# Patient Record
Sex: Female | Born: 1950
Health system: Southern US, Community
[De-identification: ages and names within clinical notes are randomized; demographics above are authoritative.]

## PROBLEM LIST (undated history)

## (undated) DIAGNOSIS — E7800 Pure hypercholesterolemia, unspecified: Secondary | ICD-10-CM

## (undated) DIAGNOSIS — I4891 Unspecified atrial fibrillation: Secondary | ICD-10-CM

## (undated) DIAGNOSIS — I10 Essential (primary) hypertension: Secondary | ICD-10-CM

## (undated) DIAGNOSIS — I499 Cardiac arrhythmia, unspecified: Secondary | ICD-10-CM

## (undated) HISTORY — PX: TUBAL LIGATION: SHX77

## (undated) HISTORY — DX: Unspecified atrial fibrillation: I48.91

---

## 2003-08-28 ENCOUNTER — Other Ambulatory Visit: Admission: RE | Admit: 2003-08-28 | Discharge: 2003-08-28 | Payer: Self-pay | Admitting: Family Medicine

## 2004-10-01 ENCOUNTER — Ambulatory Visit (HOSPITAL_COMMUNITY): Admission: RE | Admit: 2004-10-01 | Discharge: 2004-10-01 | Payer: Self-pay | Admitting: Family Medicine

## 2005-06-11 ENCOUNTER — Ambulatory Visit (HOSPITAL_COMMUNITY): Admission: RE | Admit: 2005-06-11 | Discharge: 2005-06-11 | Payer: Self-pay | Admitting: Family Medicine

## 2007-04-02 ENCOUNTER — Ambulatory Visit (HOSPITAL_COMMUNITY): Admission: RE | Admit: 2007-04-02 | Discharge: 2007-04-02 | Payer: Self-pay | Admitting: Family Medicine

## 2007-04-12 ENCOUNTER — Ambulatory Visit (HOSPITAL_COMMUNITY): Admission: RE | Admit: 2007-04-12 | Discharge: 2007-04-12 | Payer: Self-pay | Admitting: Family Medicine

## 2007-06-01 ENCOUNTER — Ambulatory Visit (HOSPITAL_COMMUNITY): Admission: RE | Admit: 2007-06-01 | Discharge: 2007-06-01 | Payer: Self-pay | Admitting: Family Medicine

## 2013-05-05 ENCOUNTER — Emergency Department (HOSPITAL_COMMUNITY)
Admission: EM | Admit: 2013-05-05 | Discharge: 2013-05-05 | Disposition: A | Discharge status: Home / Self Care | Payer: BC Managed Care – PPO | Source: Home / Self Care | Attending: Emergency Medicine | Admitting: Emergency Medicine

## 2013-05-05 ENCOUNTER — Encounter (HOSPITAL_COMMUNITY): Payer: Self-pay | Admitting: Emergency Medicine

## 2013-05-05 DIAGNOSIS — F419 Anxiety disorder, unspecified: Secondary | ICD-10-CM

## 2013-05-05 DIAGNOSIS — R079 Chest pain, unspecified: Secondary | ICD-10-CM

## 2013-05-05 DIAGNOSIS — K219 Gastro-esophageal reflux disease without esophagitis: Secondary | ICD-10-CM

## 2013-05-05 DIAGNOSIS — F411 Generalized anxiety disorder: Secondary | ICD-10-CM

## 2013-05-05 HISTORY — DX: Essential (primary) hypertension: I10

## 2013-05-05 HISTORY — DX: Pure hypercholesterolemia, unspecified: E78.00

## 2013-05-05 MED ORDER — LORAZEPAM 0.5 MG PO TABS: 0.5000 mg | ORAL_TABLET | Freq: Three times a day (TID) | ORAL | Status: DC | PRN

## 2013-05-05 NOTE — ED Provider Notes (Signed)
Medical screening examination/treatment/procedure(s) were performed by non-physician practitioner and as supervising physician I was immediately available for consultation/collaboration.  Philipp Deputy, M.D.  Harden Mo, MD 05/05/13 [phone]

## 2013-05-05 NOTE — ED Notes (Signed)
Patient reports a 2 day history of chest pain that predictably has occurred for 2 days, pain occurs between 10 and 12 every morning.  Patient reports "drinking dark soda and taking antacids ", followed by belching which relieved patient of right chest pain.  This pain was sharp when it occurred.  Patient reports feeling nervous with episodes, reports feeling nervous with chest pain since first started.  Patient has felt overwhelming nervousness yesterday, noted fast heart rate last evening.  No nausea, no vomiting with episode.  This morning patient feeling weak, drained, tired, and nervous.  Reports she went to work and co-worker reported bp of 170/90.  Denies any pain today, no pain currently.

## 2013-05-05 NOTE — Discharge Instructions (Signed)
Switch from ranitidine to omeprazole (generic version of Prilosec). Follow up with your own doctor about your anxiety. Also follow up with the cardiologist to have your heart checked.    Chest Pain (Nonspecific) It is often hard to give a specific diagnosis for the cause of chest pain. There is always a chance that your pain could be related to something serious, such as a heart attack or a blood clot in the lungs. You need to follow up with your caregiver for further evaluation. CAUSES   Heartburn.  Pneumonia or bronchitis.  Anxiety or stress.  Inflammation around your heart (pericarditis) or lung (pleuritis or pleurisy).  A blood clot in the lung.  A collapsed lung (pneumothorax). It can develop suddenly on its own (spontaneous pneumothorax) or from injury (trauma) to the chest.  Shingles infection (herpes zoster virus). The chest wall is composed of bones, muscles, and cartilage. Any of these can be the source of the pain.  The bones can be bruised by injury.  The muscles or cartilage can be strained by coughing or overwork.  The cartilage can be affected by inflammation and become sore (costochondritis). DIAGNOSIS  Lab tests or other studies, such as X-rays, electrocardiography, stress testing, or cardiac imaging, may be needed to find the cause of your pain.  TREATMENT   Treatment depends on what may be causing your chest pain. Treatment may include:  Acid blockers for heartburn.  Anti-inflammatory medicine.  Pain medicine for inflammatory conditions.  Antibiotics if an infection is present.  You may be advised to change lifestyle habits. This includes stopping smoking and avoiding alcohol, caffeine, and chocolate.  You may be advised to keep your head raised (elevated) when sleeping. This reduces the chance of acid going backward from your stomach into your esophagus.  Most of the time, nonspecific chest pain will improve within 2 to 3 days with rest and mild pain  medicine. HOME CARE INSTRUCTIONS   If antibiotics were prescribed, take your antibiotics as directed. Finish them even if you start to feel better.  For the next few days, avoid physical activities that bring on chest pain. Continue physical activities as directed.  Do not smoke.  Avoid drinking alcohol.  Only take over-the-counter or prescription medicine for pain, discomfort, or fever as directed by your caregiver.  Follow your caregiver's suggestions for further testing if your chest pain does not go away.  Keep any follow-up appointments you made. If you do not go to an appointment, you could develop lasting (chronic) problems with pain. If there is any problem keeping an appointment, you must call to reschedule. SEEK MEDICAL CARE IF:   You think you are having problems from the medicine you are taking. Read your medicine instructions carefully.  Your chest pain does not go away, even after treatment.  You develop a rash with blisters on your chest. SEEK IMMEDIATE MEDICAL CARE IF:   You have increased chest pain or pain that spreads to your arm, neck, jaw, back, or abdomen.  You develop shortness of breath, an increasing cough, or you are coughing up blood.  You have severe back or abdominal pain, feel nauseous, or vomit.  You develop severe weakness, fainting, or chills.  You have a fever. THIS IS AN EMERGENCY. Do not wait to see if the pain will go away. Get medical help at once. Call your local emergency services (911 in [place] Do not drive yourself to the hospital. MAKE SURE YOU:   Understand these instructions.  Will  watch your condition.  Will get help right away if you are not doing well or get worse. Document Released: 10/02/2004 Document Revised: 03/17/2011 Document Reviewed: 07/29/2007 [facility] Patient Information 2014 [place].  Gastroesophageal Reflux Disease, Adult Gastroesophageal reflux disease (GERD) happens when acid from your stomach goes  into your food pipe (esophagus). The acid can cause a burning feeling in your chest. Over time, the acid can make small holes (ulcers) in your food pipe.  HOME CARE  Ask your doctor for advice about:  Losing weight.  Quitting smoking.  Alcohol use.  Avoid foods and drinks that make your problems worse. You may want to avoid:  Caffeine and alcohol.  Chocolate.  Mints.  Garlic and onions.  Spicy foods.  Citrus fruits, such as oranges, lemons, or limes.  Foods that contain tomato, such as sauce, chili, salsa, and pizza.  Fried and fatty foods.  Avoid lying down for 3 hours before you go to bed or before you take a nap.  Eat small meals often, instead of large meals.  Wear loose-fitting clothing. Do not wear anything tight around your waist.  Raise (elevate) the head of your bed 6 to 8 inches with wood blocks. Using extra pillows does not help.  Only take medicines as told by your doctor.  Do not take aspirin or ibuprofen. GET HELP RIGHT AWAY IF:   You have pain in your arms, neck, jaw, teeth, or back.  Your pain gets worse or changes.  You feel sick to your stomach (nauseous), throw up (vomit), or sweat (diaphoresis).  You feel short of breath, or you pass out (faint).  Your throw up is green, yellow, black, or looks like coffee grounds or blood.  Your poop (stool) is red, bloody, or black. MAKE SURE YOU:   Understand these instructions.  Will watch your condition.  Will get help right away if you are not doing well or get worse. Document Released: 06/11/2007 Document Revised: 03/17/2011 Document Reviewed: 07/12/2010 [facility] Patient Information 2014 [place], [place].

## 2013-05-05 NOTE — ED Provider Notes (Signed)
CSN: [id number]     Arrival date & time 05/05/13  0907 History   First MD Initiated Contact with Patient 05/05/13 1002     Chief Complaint  Patient presents with  . Anxiety  . Chest Pain   (Consider location/radiation/quality/duration/timing/severity/associated sxs/prior Treatment) HPI Comments: Pt describes having R chest pain on 4/28 and 4/29 from approximately 10am to 12pm.  Treated self with diet Dr. Malachi Moran and otc zantac for complete relief of sx both days.  Separately, the evening of 4/29, pt was driving home from work when began feeling very "nervous" to the point she was shaking. No chest pain at this time, but felt heart was racing (not irregular, just fast).  Pt has remained nervous all last night and is still nervous today. At work this morning, she checked her bp and it was 170/90 so pt decided to come to urgent care. No chest pain sx today, only the nervous feeling. Has had similar nervous feelings in the past ("but not in a long time") and cannot identify any new stressors or triggers in her life that could be causing it except increase in work hours (which pt reports she asked for and wanted).    Patient is a 63 y.o. female presenting with chest pain. The history is provided by the patient.  Chest Pain Pain location:  R chest Pain quality comment:  Unable to specify; describes only as "pain" Pain radiates to:  Does not radiate Pain radiates to the back: no   Pain severity:  Moderate Onset quality:  Gradual Duration:  2 hours Timing:  Sporadic Progression:  Resolved Chronicity:  New Relieved by: zantac and a diet dr. Malachi Moran. Worsened by:  Nothing tried Associated symptoms: anxiety and heartburn   Associated symptoms: no abdominal pain, no back pain, no diaphoresis, no dizziness, no fever, no nausea, no palpitations, no shortness of breath and not vomiting   Risk factors: high cholesterol and hypertension     Past Medical History  Diagnosis Date  . Hypertension   . High  cholesterol    Past Surgical History  Procedure Laterality Date  . Tubal ligation     History reviewed. No pertinent family history. History  Substance Use Topics  . Smoking status: Never Smoker   . Smokeless tobacco: Not on file  . Alcohol Use: No   OB History   Grav Para Term Preterm Abortions TAB SAB Ect Mult Living                 Review of Systems  Constitutional: Negative for fever and diaphoresis.  Respiratory: Negative for shortness of breath.   Cardiovascular: Positive for chest pain. Negative for palpitations and leg swelling.  Gastrointestinal: Positive for heartburn. Negative for nausea, vomiting and abdominal pain.  Musculoskeletal: Negative for back pain.  Neurological: Negative for dizziness and syncope.  Psychiatric/Behavioral: The patient is nervous/anxious.     Allergies  Review of patient's allergies indicates no known allergies.  Home Medications   Prior to Admission medications   Medication Sig Start Date End Date Taking? Authorizing Provider  LISINOPRIL-HYDROCHLOROTHIAZIDE PO Take by mouth.   Yes Historical Provider, MD  lovastatin (MEVACOR) 40 MG tablet Take 40 mg by mouth at bedtime.   Yes Historical Provider, MD  ranitidine (ZANTAC) 75 MG tablet Take 75 mg by mouth as needed for heartburn.   Yes Historical Provider, MD  LORazepam (ATIVAN) 0.5 MG tablet Take 1 tablet (0.5 mg total) by mouth every 8 (eight) hours as needed for anxiety.  05/05/13   Carvel Getting, NP   BP 120/83  Pulse 90  Temp(Src) 98.1 F (36.7 C) (Oral)  Resp 20  SpO2 99% Physical Exam  Constitutional: She appears well-developed and well-nourished. No distress.  Cardiovascular: Normal rate and regular rhythm.   Pulmonary/Chest: Effort normal and breath sounds normal.  Abdominal: Soft. Bowel sounds are normal. She exhibits no distension. There is no tenderness. There is no rebound and no guarding.  Skin: Skin is warm and dry.  Psychiatric: Her speech is normal and behavior is  normal. Judgment and thought content normal. Her mood appears anxious. Cognition and memory are normal.    ED Course  Procedures (including critical care time) Labs Review Labs Reviewed - No data to display  Imaging Review No results found.   MDM   1. Anxiety   2. GERD (gastroesophageal reflux disease)   3. Chest pain   EKG shows NSR, 82bpm.  I think pt has 2 separate issues, heartburn/gerd sx and anxiety.  Rx lorazepam 0.5mg  TID prn anxiety #10. Suggested pt switch to prilosec from zantac and take daily. Given [facility] cardiology contact info for f/u; pt reports has had stress testing but it has been many years. Pt to f/u with pcp about anxiety and gerd sx, and about chest pain- pt can either go through pcp to arrange visit with cardiologist or use referral from today.     Carvel Getting, NP 05/05/13 1025

## 2013-05-05 NOTE — ED Notes (Signed)
Patient is dressed and ready for discharge

## 2013-07-21 ENCOUNTER — Emergency Department (HOSPITAL_COMMUNITY)
Admission: EM | Admit: 2013-07-21 | Discharge: 2013-07-21 | Disposition: A | Discharge status: Other Acute Inpatient Hospital | Payer: BC Managed Care – PPO | Source: Home / Self Care | Attending: Family Medicine | Admitting: Family Medicine

## 2013-07-21 ENCOUNTER — Encounter (HOSPITAL_COMMUNITY): Payer: Self-pay | Admitting: Emergency Medicine

## 2013-07-21 ENCOUNTER — Inpatient Hospital Stay (HOSPITAL_COMMUNITY)
Admission: EM | Admit: 2013-07-21 | Discharge: 2013-07-23 | DRG: 309 | Disposition: A | Discharge status: Home / Self Care | Payer: BC Managed Care – PPO | Attending: Interventional Cardiology | Admitting: Interventional Cardiology

## 2013-07-21 ENCOUNTER — Emergency Department (HOSPITAL_COMMUNITY): Payer: BC Managed Care – PPO

## 2013-07-21 DIAGNOSIS — I4891 Unspecified atrial fibrillation: Secondary | ICD-10-CM | POA: Diagnosis present

## 2013-07-21 DIAGNOSIS — I480 Paroxysmal atrial fibrillation: Secondary | ICD-10-CM

## 2013-07-21 DIAGNOSIS — E7800 Pure hypercholesterolemia, unspecified: Secondary | ICD-10-CM | POA: Diagnosis present

## 2013-07-21 DIAGNOSIS — I10 Essential (primary) hypertension: Secondary | ICD-10-CM | POA: Insufficient documentation

## 2013-07-21 DIAGNOSIS — R112 Nausea with vomiting, unspecified: Secondary | ICD-10-CM

## 2013-07-21 DIAGNOSIS — E669 Obesity, unspecified: Secondary | ICD-10-CM | POA: Diagnosis present

## 2013-07-21 DIAGNOSIS — Z6841 Body Mass Index (BMI) 40.0 and over, adult: Secondary | ICD-10-CM | POA: Diagnosis not present

## 2013-07-21 HISTORY — DX: Cardiac arrhythmia, unspecified: I49.9

## 2013-07-21 LAB — CBC
HCT: 37.3 % (ref 36.0–46.0)
Hemoglobin: 12.3 g/dL (ref 12.0–15.0)
MCH: 28.2 pg (ref 26.0–34.0)
MCHC: 33 g/dL (ref 30.0–36.0)
MCV: 85.6 fL (ref 78.0–100.0)
Platelets: 302 K/uL (ref 150–400)
RBC: 4.36 MIL/uL (ref 3.87–5.11)
RDW: 13.4 % (ref 11.5–15.5)
WBC: 8.3 K/uL (ref 4.0–10.5)

## 2013-07-21 LAB — URINALYSIS, ROUTINE W REFLEX MICROSCOPIC
Bilirubin Urine: NEGATIVE
Glucose, UA: NEGATIVE mg/dL
Hgb urine dipstick: NEGATIVE
Ketones, ur: NEGATIVE mg/dL
Leukocytes, UA: NEGATIVE
Nitrite: NEGATIVE
Protein, ur: NEGATIVE mg/dL
Specific Gravity, Urine: 1.009 (ref 1.005–1.030)
Urobilinogen, UA: 0.2 mg/dL (ref 0.0–1.0)
pH: 5.5 (ref 5.0–8.0)

## 2013-07-21 LAB — BASIC METABOLIC PANEL WITH GFR
Anion gap: 18 — ABNORMAL HIGH (ref 5–15)
BUN: 34 mg/dL — ABNORMAL HIGH (ref 6–23)
CO2: 24 meq/L (ref 19–32)
Calcium: 8.9 mg/dL (ref 8.4–10.5)
Chloride: 96 meq/L (ref 96–112)
Creatinine, Ser: 1.08 mg/dL (ref 0.50–1.10)
GFR calc Af Amer: 63 mL/min — ABNORMAL LOW (ref >90)
GFR calc non Af Amer: 54 mL/min — ABNORMAL LOW (ref >90)
Glucose, Bld: 110 mg/dL — ABNORMAL HIGH (ref 70–99)
Potassium: 3.8 meq/L (ref 3.7–5.3)
Sodium: 138 meq/L (ref 137–147)

## 2013-07-21 LAB — PRO B NATRIURETIC PEPTIDE: Pro B Natriuretic peptide (BNP): 398.6 pg/mL — ABNORMAL HIGH (ref 0–125)

## 2013-07-21 LAB — TROPONIN I
Troponin I: <0.3 ng/mL (ref <0.30)
Troponin I: <0.3 ng/mL (ref <0.30)

## 2013-07-21 LAB — MAGNESIUM: Magnesium: 2 mg/dL (ref 1.5–2.5)

## 2013-07-21 MED ORDER — ACETAMINOPHEN 325 MG PO TABS: 650.0000 mg | ORAL_TABLET | ORAL | Status: DC | PRN

## 2013-07-21 MED ORDER — ZOLPIDEM TARTRATE 5 MG PO TABS: 5.0000 mg | ORAL_TABLET | Freq: Every evening | ORAL | Status: DC | PRN

## 2013-07-21 MED ORDER — DILTIAZEM LOAD VIA INFUSION
20.0000 mg | Freq: Once | INTRAVENOUS | Status: AC
  Administered 2013-07-21: 20 mg via INTRAVENOUS
  Filled 2013-07-21: qty 20

## 2013-07-21 MED ORDER — ONDANSETRON HCL 4 MG/2ML IJ SOLN: 4.0000 mg | Freq: Four times a day (QID) | INTRAMUSCULAR | Status: DC | PRN

## 2013-07-21 MED ORDER — ALPRAZOLAM 0.25 MG PO TABS: 0.2500 mg | ORAL_TABLET | Freq: Two times a day (BID) | ORAL | Status: DC | PRN

## 2013-07-21 MED ORDER — DILTIAZEM HCL 100 MG IV SOLR
5.0000 mg/h | INTRAVENOUS | Status: DC
  Administered 2013-07-21: 15 mg/h via INTRAVENOUS

## 2013-07-21 MED ORDER — HEPARIN BOLUS VIA INFUSION
4000.0000 [IU] | Freq: Once | INTRAVENOUS | Status: AC
  Administered 2013-07-21: 4000 [IU] via INTRAVENOUS
  Filled 2013-07-21: qty 4000

## 2013-07-21 MED ORDER — DILTIAZEM HCL 100 MG IV SOLR
5.0000 mg/h | INTRAVENOUS | Status: DC
  Administered 2013-07-21: 15 mg/h via INTRAVENOUS
  Administered 2013-07-22: 10 mg/h via INTRAVENOUS

## 2013-07-21 MED ORDER — HEPARIN (PORCINE) IN NACL 100-0.45 UNIT/ML-% IJ SOLN
1400.0000 [IU]/h | INTRAMUSCULAR | Status: DC
  Administered 2013-07-21 – 2013-07-22 (×2): 1300 [IU]/h via INTRAVENOUS
  Administered 2013-07-23: 1400 [IU]/h via INTRAVENOUS
  Filled 2013-07-21 (×5): qty 250

## 2013-07-21 MED ORDER — SODIUM CHLORIDE 0.9 % IV BOLUS (SEPSIS)
1000.0000 mL | Freq: Once | INTRAVENOUS | Status: AC
Start: 2013-07-21 — End: 2013-07-21
  Administered 2013-07-21: 1000 mL via INTRAVENOUS

## 2013-07-21 MED ORDER — SODIUM CHLORIDE 0.9 % IV SOLN
Freq: Once | INTRAVENOUS | Status: AC
  Administered 2013-07-21: 09:00:00 via INTRAVENOUS

## 2013-07-21 NOTE — ED Provider Notes (Signed)
Wendy Moran is a 63 y.o. female who presents to Urgent Care today for dizziness and lightheadedness. Patient has had nausea with a few episodes of vomiting over the past 3 days. She has become dizzy over the past 4 days. She describes dizziness worse with standing and better with rest. She denies any chest pains palpitations or shortness of breath. She has not tried any medications yet. She feels well otherwise. She denies any personal history of a for fibrillation.   Past Medical History  Diagnosis Date  . Hypertension   . High cholesterol    History  Substance Use Topics  . Smoking status: Never Smoker   . Smokeless tobacco: Not on file  . Alcohol Use: No   ROS as above Medications: Current Facility-Administered Medications  Medication Dose Route Frequency Provider Last Rate Last Dose  . 0.9 %  sodium chloride infusion   Intravenous Once Gregor Hams, MD       Current Outpatient Prescriptions  Medication Sig Dispense Refill  . LISINOPRIL-HYDROCHLOROTHIAZIDE PO Take by mouth.      Marland Kitchen LORazepam (ATIVAN) 0.5 MG tablet Take 1 tablet (0.5 mg total) by mouth every 8 (eight) hours as needed for anxiety.  10 tablet  0  . lovastatin (MEVACOR) 40 MG tablet Take 40 mg by mouth at bedtime.      . ranitidine (ZANTAC) 75 MG tablet Take 75 mg by mouth as needed for heartburn.        Exam:  Pulse 59  Temp(Src) 98.3 F (36.8 C) (Oral)  Resp 18  SpO2 100%  Orthostatic Lying - BP- Lying: 103/74 mmHg ; Pulse- Lying: 59  Orthostatic Sitting - BP- Sitting: 105/61 mmHg ; Pulse- Sitting: 72  Orthostatic Standing at 0 minutes - BP- Standing at 0 minutes: 123/89 mmHg ; Pulse- Standing at 0 minutes: 88  Gen: Well NAD HEENT: EOMI,  MMM Lungs: Normal work of breathing. CTABL Heart: Tachycardic no MRG Abd: NABS, Soft. NT, ND Exts: Brisk capillary refill, warm and well perfused.   Twelve-lead EKG shows atrial fibrillation with ventricular rate of around 160 beats per minute. No ST segment  elevation or depression. No Q waves.  No results found for this or any previous visit (from the past 24 hour(s)). No results found.  Assessment and Plan: 63 y.o. female with atrial fibrillation with RVR. This is likely in the setting of viral gastroenteritis. Plan to start an IV and transient emergency room the EMS. Patient denies any chest pain.  Discussed warning signs or symptoms. Please see discharge instructions. Patient expresses understanding.    Gregor Hams, MD 07/21/13 [phone]

## 2013-07-21 NOTE — H&P (Signed)
Wendy Moran is an 63 y.o. female.   Primary Cardiologist: Dr. Odette Fraction PMD: Chief Complaint:  Weakness HPI: 63 year old woman who has been feeling poorly over the past 3-4 days. She has had nausea and intermittent vomiting for the past 3 days. She is felt weak when she walks. She feels like she may pass out she walks long enough distance. She has been generally fatigued as well. She denies any chest pain or shortness of breath.  Of note, she had a visit to the urgent care about 3 months ago. She felt very anxious at that time. She was given anti-anxiety medicine. She does note some similarities between her symptoms now and the symptoms she had a few months ago.  Past Medical History  Diagnosis Date  . Hypertension   . High cholesterol     Past Surgical History  Procedure Laterality Date  . Tubal ligation       father had 2 MIs but no revascularization Social History:  reports that she has never smoked. She does not have any smokeless tobacco history on file. She reports that she does not drink alcohol or use illicit drugs.  Allergies: No Known Allergies   (Not in a hospital admission)  Results for orders placed during the hospital encounter of 07/21/13 (from the past 48 hour(s))  BASIC METABOLIC PANEL     Status: Abnormal   Collection Time    07/21/13 10:05 AM      Result Value Ref Range   Sodium 138  137 - 147 mEq/L   Potassium 3.8  3.7 - 5.3 mEq/L   Chloride 96  96 - 112 mEq/L   CO2 24  19 - 32 mEq/L   Glucose, Bld 110 (*) 70 - 99 mg/dL   BUN 34 (*) 6 - 23 mg/dL   Creatinine, Ser 1.08  0.50 - 1.10 mg/dL   Calcium 8.9  8.4 - 10.5 mg/dL   GFR calc non Af Amer 54 (*) >90 mL/min   GFR calc Af Amer 63 (*) >90 mL/min   Comment: (NOTE)     The eGFR has been calculated using the CKD EPI equation.     This calculation has not been validated in all clinical situations.     eGFR's persistently <90 mL/min signify possible Chronic Kidney     Disease.   Anion gap 18  (*) 5 - 15  CBC     Status: None   Collection Time    07/21/13 10:05 AM      Result Value Ref Range   WBC 8.3  4.0 - 10.5 K/uL   RBC 4.36  3.87 - 5.11 MIL/uL   Hemoglobin 12.3  12.0 - 15.0 g/dL   HCT 37.3  36.0 - 46.0 %   MCV 85.6  78.0 - 100.0 fL   MCH 28.2  26.0 - 34.0 pg   MCHC 33.0  30.0 - 36.0 g/dL   RDW 13.4  11.5 - 15.5 %   Platelets 302  150 - 400 K/uL  MAGNESIUM     Status: None   Collection Time    07/21/13 10:05 AM      Result Value Ref Range   Magnesium 2.0  1.5 - 2.5 mg/dL  PRO B NATRIURETIC PEPTIDE     Status: Abnormal   Collection Time    07/21/13 10:05 AM      Result Value Ref Range   Pro B Natriuretic peptide (BNP) 398.6 (*) 0 - 125 pg/mL  TROPONIN I  Status: None   Collection Time    07/21/13 10:05 AM      Result Value Ref Range   Troponin I <0.30  <0.30 ng/mL   Comment:            Due to the release kinetics of cTnI,     a negative result within the first hours     of the onset of symptoms does not rule out     myocardial infarction with certainty.     If myocardial infarction is still suspected,     repeat the test at appropriate intervals.  URINALYSIS, ROUTINE W REFLEX MICROSCOPIC     Status: Abnormal   Collection Time    07/21/13 10:52 AM      Result Value Ref Range   Color, Urine YELLOW  YELLOW   APPearance CLOUDY (*) CLEAR   Specific Gravity, Urine 1.009  1.005 - 1.030   pH 5.5  5.0 - 8.0   Glucose, UA NEGATIVE  NEGATIVE mg/dL   Hgb urine dipstick NEGATIVE  NEGATIVE   Bilirubin Urine NEGATIVE  NEGATIVE   Ketones, ur NEGATIVE  NEGATIVE mg/dL   Protein, ur NEGATIVE  NEGATIVE mg/dL   Urobilinogen, UA 0.2  0.0 - 1.0 mg/dL   Nitrite NEGATIVE  NEGATIVE   Leukocytes, UA NEGATIVE  NEGATIVE   Comment: MICROSCOPIC NOT DONE ON URINES WITH NEGATIVE PROTEIN, BLOOD, LEUKOCYTES, NITRITE, OR GLUCOSE <1000 mg/dL.   Dg Chest Port 1 View  07/21/2013   CLINICAL DATA:  Shortness of breath.  EXAM: PORTABLE CHEST - 1 VIEW  COMPARISON:  Jun 01, 2007   FINDINGS: Stable cardiomediastinal silhouette. No pneumothorax or pleural effusion is noted. No acute pulmonary disease is noted. Bony thorax is intact.  IMPRESSION: No acute cardiopulmonary abnormality seen.   Electronically Signed   By: Sabino Dick M.D.   On: 07/21/2013 09:57    ROS: As above, no swelling. Nausea and vomiting as noted. No syncope.  OBJECTIVE:   Vitals:   Filed Vitals:   07/21/13 1230 07/21/13 1300 07/21/13 1315 07/21/13 1330  BP: 93/65 95/67 110/82 90/74  Pulse:  73 95 64  Temp:      TempSrc:      Resp: '19 17 18 14  ' Height:      Weight:      SpO2: 100% 100% 100% 100%   I&O's:  No intake or output data in the 24 hours ending 07/21/13 1349 TELEMETRY: Reviewed telemetry pt in atrial fibrillation with rapid response:     PHYSICAL EXAM General: Well developed, well nourished, in no acute distress Head:   Normal cephalic and atramatic  Lungs:   Clear bilaterally to auscultation. Heart:  Irregularly irregular, S1 S2  No JVD.   Abdomen: abdomen soft and non-tender Msk:  Back normal,  Normal strength and tone for age. Extremities:  No edema.   Neuro: Alert and oriented. Psych:  Normal affect, responds appropriately  LABS: Basic Metabolic Panel:  Recent Labs  07/21/13 1005  NA 138  K 3.8  CL 96  CO2 24  GLUCOSE 110*  BUN 34*  CREATININE 1.08  CALCIUM 8.9  MG 2.0   Liver Function Tests: No results found for this basename: AST, ALT, ALKPHOS, BILITOT, PROT, ALBUMIN,  in the last 72 hours No results found for this basename: LIPASE, AMYLASE,  in the last 72 hours CBC:  Recent Labs  07/21/13 1005  WBC 8.3  HGB 12.3  HCT 37.3  MCV 85.6  PLT 302  Cardiac Enzymes:  Recent Labs  07/21/13 1005  TROPONINI <0.30   BNP: No components found with this basename: POCBNP,  D-Dimer: No results found for this basename: DDIMER,  in the last 72 hours Hemoglobin A1C: No results found for this basename: HGBA1C,  in the last 72 hours Fasting Lipid  Panel: No results found for this basename: CHOL, HDL, LDLCALC, TRIG, CHOLHDL, LDLDIRECT,  in the last 72 hours Thyroid Function Tests: No results found for this basename: TSH, T4TOTAL, FREET3, T3FREE, THYROIDAB,  in the last 72 hours Anemia Panel: No results found for this basename: VITAMINB12, FOLATE, FERRITIN, TIBC, IRON, RETICCTPCT,  in the last 72 hours Coag Panel:   No results found for this basename: INR, PROTIME       Assessment/Plan 1) atrial fibrillation: Continue IV Cardizem. Will plan for TEE cardioversion tomorrow. Start IV heparin for stroke prevention. We'll have to decide on long-term anticoagulation based on the results of her echocardiogram. Check TSH as well.  2) hypertension: Borderline hypotensive with diltiazem added. Hold ACE inhibitor for now to allow for more rate control. Hopefully, she will convert to normal sinus rhythm on her own.    Chantal Worthey S. 07/21/2013, 1:49 PM

## 2013-07-21 NOTE — ED Notes (Signed)
Placed on 2L oxygen

## 2013-07-21 NOTE — ED Notes (Signed)
Per EDP Knapp to increase Cardizem gtt to 20 due to HR of 129-152

## 2013-07-21 NOTE — Progress Notes (Signed)
ANTICOAGULATION CONSULT NOTE - Initial Consult  Pharmacy Consult for Heparin Indication: atrial fibrillation  No Known Allergies  Patient Measurements: Height: 5\' 7"  (170.2 cm) Weight: 256 lb (116.121 kg) IBW/kg (Calculated) : 61.6 Heparin Dosing Weight: 89 kg  Vital Signs: Temp: 97.9 F (36.6 C) (07/16 0931) Temp src: Oral (07/16 0931) BP: 90/74 mmHg (07/16 1330) Pulse Rate: 64 (07/16 1330)  Labs:  Recent Labs  07/21/13 1005  HGB 12.3  HCT 37.3  PLT 302  CREATININE 1.08  TROPONINI <0.30    Estimated Creatinine Clearance: 72 ml/min (by C-G formula based on Cr of 1.08).   Medical History: Past Medical History  Diagnosis Date  . Hypertension   . High cholesterol     Medications:  See electronic med rec  Assessment: 63 y.o. female presents with weakness. Pt found to be in afib. Plan for TEE cardioversion tomorrow. To begin IV heparin. Plan for long-term anticoagulation based on ECHO results. CBC ok at baseline.  Goal of Therapy:  Heparin level 0.3-0.7 units/ml Monitor platelets by anticoagulation protocol: Yes   Plan:  1. Heparin IV bolus 4000 units 2. Heparin IV gtt at 1300 units/hr 3. Will f/u 6 hr heparin level 4. Daily heparin level and CBC  Sherlon Handing, PharmD, BCPS Clinical pharmacist, pager [phone] 07/21/2013,2:13 PM

## 2013-07-21 NOTE — ED Provider Notes (Signed)
Medical screening examination/treatment/procedure(s) were conducted as a shared visit with non-physician practitioner(s) and myself.  I personally evaluated the patient during the encounter.   EKG Interpretation   Date/Time:  Thursday July 21 2013 09:37:18 EDT Ventricular Rate:  159 PR Interval:    QRS Duration: 83 QT Interval:  299 QTC Calculation: 486 R Axis:   67 Text Interpretation:  Atrial fibrillation Low voltage, precordial leads  Borderline prolonged QT interval new since ECG 05 May 2013 Confirmed by  Callen Zuba  MD-J, Aayushi Solorzano ([id number]) on 07/21/2013 12:44:09 PM      Pt presented with new onset a fib with RVR.  Required cardizem infusion but still remained tachycardic.  Pt evaluated by cardiology in the ED.  Admitted for further treatment.  Dorie Rank, MD 07/21/13 [phone]

## 2013-07-21 NOTE — ED Notes (Signed)
Pt started feeling lightheaded/dizzy on Monday. Went to urgent care today found to be in Afib - new onset rate 140-170's. Pt denies chest pain. BP 125/101. Pt alert and oriented

## 2013-07-21 NOTE — ED Notes (Signed)
See physician note

## 2013-07-21 NOTE — ED Provider Notes (Signed)
CSN: [id number]     Arrival date & time 07/21/13  0272 History   First MD Initiated Contact with Patient 07/21/13 0932     Chief Complaint  Patient presents with  . Atrial Fibrillation     (Consider location/radiation/quality/duration/timing/severity/associated sxs/prior Treatment) HPI  Wendy Moran is a 63 y.o. female sent from urgent care for new onset atrial fibrillation. Patient states that she has been feeling fatigued and lightheaded starting 4 days ago. There've been several episodes of emesis during this time. Patient denies any fever chills or diarrhea, chest pain, shortness of breath, abdominal pain.  States normal stress test approximately 10 years ago. Characterizes her hypertension is well-controlled, takes her medications regularly.  Past Medical History  Diagnosis Date  . Hypertension   . High cholesterol    Past Surgical History  Procedure Laterality Date  . Tubal ligation     History reviewed. No pertinent family history. History  Substance Use Topics  . Smoking status: Never Smoker   . Smokeless tobacco: Not on file  . Alcohol Use: No   OB History   Grav Para Term Preterm Abortions TAB SAB Ect Mult Living                 Review of Systems  10 systems reviewed and found to be negative, except as noted in the HPI.  Allergies  Review of patient's allergies indicates no known allergies.  Home Medications   Prior to Admission medications   Medication Sig Start Date End Date Taking? Authorizing Provider  lisinopril-hydrochlorothiazide (PRINZIDE,ZESTORETIC) 20-12.5 MG per tablet Take 1 tablet by mouth daily.   Yes Historical Provider, MD   BP 110/82  Pulse 95  Temp(Src) 97.9 F (36.6 C) (Oral)  Resp 18  Ht 5\' 7"  (1.702 m)  Wt 256 lb (116.121 kg)  BMI 40.09 kg/m2  SpO2 100% Physical Exam  Nursing note and vitals reviewed. Constitutional: She is oriented to person, place, and time. She appears well-developed and well-nourished. No distress.   Obese  HENT:  Head: Normocephalic.  Eyes: Conjunctivae and EOM are normal. Pupils are equal, round, and reactive to light.  Neck: No JVD present.  Cardiovascular:  Tachycardia in the 150s  Pulmonary/Chest: Breath sounds normal. No stridor. No respiratory distress. She has no wheezes. She has no rales. She exhibits no tenderness.  Tachypneic  Abdominal: Soft. There is no tenderness.  Musculoskeletal: Normal range of motion. She exhibits no edema.  Neurological: She is alert and oriented to person, place, and time.  Psychiatric: She has a normal mood and affect.    ED Course  Procedures (including critical care time)  CRITICAL CARE Performed by: Monico Blitz   Total critical care time: 55  Critical care time was exclusive of separately billable procedures and treating other patients.  Critical care was necessary to treat or prevent imminent or life-threatening deterioration.  Critical care was time spent personally by me on the following activities: development of treatment plan with patient and/or surrogate as well as nursing, discussions with consultants, evaluation of patient's response to treatment, examination of patient, obtaining history from patient or surrogate, ordering and performing treatments and interventions, ordering and review of laboratory studies, ordering and review of radiographic studies, pulse oximetry and re-evaluation of patient's condition.  Labs Review Labs Reviewed  BASIC METABOLIC PANEL - Abnormal; Notable for the following:    Glucose, Bld 110 (*)    BUN 34 (*)    GFR calc non Af Amer 54 (*)  GFR calc Af Amer 63 (*)    Anion gap 18 (*)    All other components within normal limits  PRO B NATRIURETIC PEPTIDE - Abnormal; Notable for the following:    Pro B Natriuretic peptide (BNP) 398.6 (*)    All other components within normal limits  URINALYSIS, ROUTINE W REFLEX MICROSCOPIC - Abnormal; Notable for the following:    APPearance CLOUDY (*)     All other components within normal limits  CBC  MAGNESIUM  TROPONIN I    Imaging Review Dg Chest Port 1 View  07/21/2013   CLINICAL DATA:  Shortness of breath.  EXAM: PORTABLE CHEST - 1 VIEW  COMPARISON:  Jun 01, 2007  FINDINGS: Stable cardiomediastinal silhouette. No pneumothorax or pleural effusion is noted. No acute pulmonary disease is noted. Bony thorax is intact.  IMPRESSION: No acute cardiopulmonary abnormality seen.   Electronically Signed   By: Sabino Dick M.D.   On: 07/21/2013 09:57     EKG Interpretation   Date/Time:  Thursday July 21 2013 09:37:18 EDT Ventricular Rate:  159 PR Interval:    QRS Duration: 83 QT Interval:  299 QTC Calculation: 486 R Axis:   67 Text Interpretation:  Atrial fibrillation Low voltage, precordial leads  Borderline prolonged QT interval new since ECG 05 May 2013 Confirmed by  KNAPP  MD-J, JON ([id number]) on 07/21/2013 12:44:09 PM     A. fib with RVR at approximately 160 beats per minute. Borderline prolonged QTC. MDM   Final diagnoses:  Atrial fibrillation with rapid ventricular response   Filed Vitals:   07/21/13 1215 07/21/13 1230 07/21/13 1300 07/21/13 1315  BP: 111/84 93/65 95/67  110/82  Pulse: 110  73 95  Temp:      TempSrc:      Resp: 29 19 17 18   Height:      Weight:      SpO2: 100% 100% 100% 100%    Medications  diltiazem (CARDIZEM) 1 mg/mL load via infusion 20 mg (0 mg Intravenous Stopped 07/21/13 0958)    And  diltiazem (CARDIZEM) 100 mg in dextrose 5 % 100 mL infusion (15 mg/hr Intravenous Rate/Dose Change 07/21/13 0954)  sodium chloride 0.9 % bolus 1,000 mL (1,000 mLs Intravenous New Bag/Given 07/21/13 1237)    [name] is a 63 y.o. female presenting with new onset atrial fibrillation, patient's symptomatic with lightheadedness and fatigue. Onset approximately 4 days ago. Patient has strong blood pressure 166/123, plan to bolus 20 mg Cardizem and started on drip.  Chads2 to score 1 based on  history of  hypertension.   Patient with normal troponin very mildly elevated pro BNP at 400. Slightly increased gap at 18. Chest x-ray with no signs of CHF.  Patient still in RVR, drip given at 15  Heart rate remains in the 150s will increase drip 220 cc per minute.  Patient's heart rate is still in the 130s however her systolic pressure is soft at 90, drip reduced to 15.  Patient's systolic has improved to 053 heart rate remains in the 130s. 1:30 PM I contacted Trish from our she confirms that Dr. Irish Lack  on his way to see the patient.  The patient is urinating frequently: No signs of urinary tract infection or hyperglycemia.   Dr. Irish Lack has seen the patient: Plan is to admit. Heparin initiated and TEE pre-cardioversion planned for tomorrow.      Monico Blitz, PA-C 07/21/13 1516

## 2013-07-21 NOTE — ED Notes (Signed)
MD at bedside. Cards at bedside

## 2013-07-22 ENCOUNTER — Other Ambulatory Visit: Payer: Self-pay

## 2013-07-22 ENCOUNTER — Encounter (HOSPITAL_COMMUNITY): Payer: Self-pay | Admitting: *Deleted

## 2013-07-22 ENCOUNTER — Inpatient Hospital Stay (HOSPITAL_COMMUNITY): Payer: BC Managed Care – PPO | Admitting: Anesthesiology

## 2013-07-22 ENCOUNTER — Encounter (HOSPITAL_COMMUNITY): Payer: BC Managed Care – PPO | Admitting: Anesthesiology

## 2013-07-22 ENCOUNTER — Encounter (HOSPITAL_COMMUNITY)
Admission: EM | Disposition: A | Discharge status: Home / Self Care | Payer: Self-pay | Source: Home / Self Care | Attending: Interventional Cardiology

## 2013-07-22 DIAGNOSIS — I059 Rheumatic mitral valve disease, unspecified: Secondary | ICD-10-CM

## 2013-07-22 HISTORY — PX: CARDIOVERSION: SHX1299

## 2013-07-22 HISTORY — PX: TEE WITHOUT CARDIOVERSION: SHX5443

## 2013-07-22 LAB — HEPARIN LEVEL (UNFRACTIONATED)
Heparin Unfractionated: 0.36 IU/mL (ref 0.30–0.70)
Heparin Unfractionated: 0.28 IU/mL — ABNORMAL LOW (ref 0.30–0.70)
Heparin Unfractionated: 0.38 IU/mL (ref 0.30–0.70)

## 2013-07-22 LAB — TROPONIN I
Troponin I: <0.3 ng/mL (ref <0.30)
Troponin I: <0.3 ng/mL (ref <0.30)

## 2013-07-22 LAB — CBC
HCT: 36.5 % (ref 36.0–46.0)
Hemoglobin: 11.6 g/dL — ABNORMAL LOW (ref 12.0–15.0)
MCH: 28.4 pg (ref 26.0–34.0)
MCHC: 31.8 g/dL (ref 30.0–36.0)
MCV: 89.2 fL (ref 78.0–100.0)
Platelets: 272 10*3/uL (ref 150–400)
RBC: 4.09 MIL/uL (ref 3.87–5.11)
RDW: 13.6 % (ref 11.5–15.5)
WBC: 6.5 10*3/uL (ref 4.0–10.5)

## 2013-07-22 LAB — BASIC METABOLIC PANEL
Anion gap: 13 (ref 5–15)
BUN: 18 mg/dL (ref 6–23)
CO2: 26 mEq/L (ref 19–32)
Calcium: 8.5 mg/dL (ref 8.4–10.5)
Chloride: 105 mEq/L (ref 96–112)
Creatinine, Ser: 0.73 mg/dL (ref 0.50–1.10)
GFR calc Af Amer: >90 mL/min (ref >90)
GFR calc non Af Amer: >90 mL/min (ref >90)
Glucose, Bld: 112 mg/dL — ABNORMAL HIGH (ref 70–99)
Potassium: 3.9 mEq/L (ref 3.7–5.3)
Sodium: 144 mEq/L (ref 137–147)

## 2013-07-22 LAB — MRSA PCR SCREENING: MRSA by PCR: NEGATIVE

## 2013-07-22 LAB — TSH: TSH: 4.56 u[IU]/mL — ABNORMAL HIGH (ref 0.350–4.500)

## 2013-07-22 SURGERY — ECHOCARDIOGRAM, TRANSESOPHAGEAL
Anesthesia: General

## 2013-07-22 MED ORDER — DILTIAZEM HCL 60 MG PO TABS
60.0000 mg | ORAL_TABLET | Freq: Four times a day (QID) | ORAL | Status: DC
  Administered 2013-07-22 – 2013-07-23 (×4): 60 mg via ORAL
  Filled 2013-07-22 (×8): qty 1

## 2013-07-22 MED ORDER — LACTATED RINGERS IV SOLN
INTRAVENOUS | Status: DC | PRN
  Administered 2013-07-22: 08:00:00 via INTRAVENOUS

## 2013-07-22 MED ORDER — LACTATED RINGERS IV SOLN
INTRAVENOUS | Status: DC
Start: 2013-07-22 — End: 2013-07-22
  Administered 2013-07-22: 08:00:00 via INTRAVENOUS

## 2013-07-22 MED ORDER — SODIUM CHLORIDE 0.9 % IV SOLN: INTRAVENOUS | Status: DC

## 2013-07-22 MED ORDER — PROPOFOL 10 MG/ML IV BOLUS
INTRAVENOUS | Status: DC | PRN
  Administered 2013-07-22: 20 mg via INTRAVENOUS
  Administered 2013-07-22: 30 mg via INTRAVENOUS
  Administered 2013-07-22: 40 mg via INTRAVENOUS
  Administered 2013-07-22: 20 mg via INTRAVENOUS

## 2013-07-22 MED ORDER — LIDOCAINE HCL (CARDIAC) 20 MG/ML IV SOLN
INTRAVENOUS | Status: DC | PRN
  Administered 2013-07-22: 50 mg via INTRAVENOUS

## 2013-07-22 MED ORDER — SODIUM CHLORIDE 0.9 % IV SOLN: INTRAVENOUS | Status: DC | PRN

## 2013-07-22 NOTE — Progress Notes (Signed)
  Echocardiogram Echocardiogram Transesophageal has been performed.  Philipp Deputy 07/22/2013, 8:55 AM

## 2013-07-22 NOTE — Progress Notes (Signed)
ANTICOAGULATION CONSULT NOTE - Follow Up Consult  Pharmacy Consult for heparin Indication: atrial fibrillation  Labs:  Recent Labs  07/21/13 1005 07/21/13 1840 07/21/13 2315  HGB 12.3  --   --   HCT 37.3  --   --   PLT 302  --   --   HEPARINUNFRC  --   --  0.36  CREATININE 1.08  --   --   TROPONINI <0.30 <0.30  --     Assessment/Plan:  63yo female therapeutic on heparin with initial dosing for Afib. Will continue gtt at current rate and confirm stable with am labs.   Wynona Neat, PharmD, BCPS  07/22/2013,12:32 AM

## 2013-07-22 NOTE — Progress Notes (Signed)
Utilization review completed. Lynasia Meloche, RN, BSN. 

## 2013-07-22 NOTE — Interval H&P Note (Signed)
History and Physical Interval Note:  07/22/2013 7:51 AM  Wendy Moran  has presented today for surgery, with the diagnosis of A FIB  The various methods of treatment have been discussed with the patient and family. After consideration of risks, benefits and other options for treatment, the patient has consented to  Procedure(s): TRANSESOPHAGEAL ECHOCARDIOGRAM (TEE) (N/A) CARDIOVERSION (N/A) as a surgical intervention .  The patient's history has been reviewed, patient examined, no change in status, stable for surgery.  I have reviewed the patient's chart and labs.  Questions were answered to the patient's satisfaction.     Dorris Carnes

## 2013-07-22 NOTE — Op Note (Signed)
Patient anesthetized with propofol and lidocaine per anesthesia WIth pads in AP position, patient cardioverted to SR with 200 J synchronized biphasic energy. Procedure without complication.

## 2013-07-22 NOTE — Op Note (Signed)
LA, LAA without thrombus LVEF normal   Moderate MR  Full report to follow

## 2013-07-22 NOTE — Anesthesia Postprocedure Evaluation (Signed)
  Anesthesia Post-op Note  Patient: Wendy Moran  Procedure(s) Performed: Procedure(s): TRANSESOPHAGEAL ECHOCARDIOGRAM (TEE) (N/A) CARDIOVERSION (N/A)  Patient Location: PACU and Endoscopy Unit  Anesthesia Type:MAC  Level of Consciousness: awake and alert   Airway and Oxygen Therapy: Patient Spontanous Breathing  Post-op Pain: none  Post-op Assessment: Post-op Vital signs reviewed  Post-op Vital Signs: stable  Last Vitals:  Filed Vitals:   07/22/13 0920  BP: 113/70  Pulse: 74  Temp:   Resp: 15    Complications: No apparent anesthesia complications

## 2013-07-22 NOTE — Progress Notes (Signed)
ANTICOAGULATION CONSULT NOTE - Follow Up Consult  Pharmacy Consult for heparin Indication: atrial fibrillation   Labs:  Recent Labs  07/21/13 1005 07/21/13 1840 07/21/13 2315 07/22/13 0526 07/22/13 0529 07/22/13 1140  HGB 12.3  --   --   --  11.6*  --   HCT 37.3  --   --   --  36.5  --   PLT 302  --   --   --  272  --   HEPARINUNFRC  --   --  0.36  --  0.28* 0.38  CREATININE 1.08  --   --   --  0.73  --   TROPONINI <0.30 <0.30 <0.30 <0.30  --   --     Assessment: 63 y.o. female who presented with weakness and was found to be in afib. Currently therapeutic on heparin, plan for long-term anticoagulation based on ECHO results. CBC wnl, no s/sx of bleeding.  Goal of Therapy:  Heparin level 0.3-0.7 units/ml   Plan:  -Continue heparin 1,400 units/hour -Daily heparin level and CBC  Megan E. Supple, Pharm.D Clinical Pharmacy Resident Pager: [phone] 07/22/2013 1:10 PM

## 2013-07-22 NOTE — Transfer of Care (Signed)
Immediate Anesthesia Transfer of Care Note  Patient: Wendy Moran  Procedure(s) Performed: Procedure(s): TRANSESOPHAGEAL ECHOCARDIOGRAM (TEE) (N/A) CARDIOVERSION (N/A)  Patient Location: PACU and Endoscopy Unit  Anesthesia Type:MAC  Level of Consciousness: awake, alert  and oriented  Airway & Oxygen Therapy: Patient Spontanous Breathing and Patient connected to nasal cannula oxygen  Post-op Assessment: Report given to PACU RN, Post -op Vital signs reviewed and stable and Patient moving all extremities X 4  Post vital signs: Reviewed and stable  Complications: No apparent anesthesia complications

## 2013-07-22 NOTE — Progress Notes (Signed)
ANTICOAGULATION CONSULT NOTE - Follow Up Consult  Pharmacy Consult for heparin Indication: atrial fibrillation   Labs:  Recent Labs  07/21/13 1005 07/21/13 1840 07/21/13 2315 07/22/13 0529  HGB 12.3  --   --  11.6*  HCT 37.3  --   --  36.5  PLT 302  --   --  272  HEPARINUNFRC  --   --  0.36 0.28*  CREATININE 1.08  --   --   --   TROPONINI <0.30 <0.30 <0.30  --     Assessment: 63yo female now slightly subtherapeutic on heparin after one level at lower end of goal.  Goal of Therapy:  Heparin level 0.3-0.7 units/ml   Plan:  Will increase heparin gtt by ~1 unit/kg/hr to 1400 units/hr and check level in 6hr.  Wynona Neat, PharmD, BCPS  07/22/2013,6:18 AM

## 2013-07-22 NOTE — Anesthesia Preprocedure Evaluation (Addendum)
Anesthesia Evaluation  Patient identified by MRN, date of birth, ID band Patient awake    Reviewed: Allergy & Precautions, H&P , NPO status   Airway Mallampati: I      Dental  (+) Teeth Intact, Dental Advidsory Given, Caps   Pulmonary  breath sounds clear to auscultation        Cardiovascular hypertension, + dysrhythmias Atrial Fibrillation Rhythm:Irregular Rate:Normal     Neuro/Psych    GI/Hepatic   Endo/Other    Renal/GU      Musculoskeletal   Abdominal   Peds  Hematology   Anesthesia Other Findings   Reproductive/Obstetrics                          Anesthesia Physical Anesthesia Plan  ASA: III  Anesthesia Plan: General   Post-op Pain Management:    Induction: Intravenous  Airway Management Planned: Mask  Additional Equipment:   Intra-op Plan:   Post-operative Plan:   Informed Consent: I have reviewed the patients History and Physical, chart, labs and discussed the procedure including the risks, benefits and alternatives for the proposed anesthesia with the patient or authorized representative who has indicated his/her understanding and acceptance.   Dental Advisory Given  Plan Discussed with: Anesthesiologist, CRNA and Surgeon  Anesthesia Plan Comments:        Anesthesia Quick Evaluation

## 2013-07-23 LAB — CBC
HCT: 32.5 % — ABNORMAL LOW (ref 36.0–46.0)
Hemoglobin: 10.1 g/dL — ABNORMAL LOW (ref 12.0–15.0)
MCH: 27.6 pg (ref 26.0–34.0)
MCHC: 31.1 g/dL (ref 30.0–36.0)
MCV: 88.8 fL (ref 78.0–100.0)
Platelets: 257 10*3/uL (ref 150–400)
RBC: 3.66 MIL/uL — ABNORMAL LOW (ref 3.87–5.11)
RDW: 13.6 % (ref 11.5–15.5)
WBC: 7.1 10*3/uL (ref 4.0–10.5)

## 2013-07-23 LAB — HEPARIN LEVEL (UNFRACTIONATED): Heparin Unfractionated: 0.38 IU/mL (ref 0.30–0.70)

## 2013-07-23 MED ORDER — ACETAMINOPHEN 325 MG PO TABS: 650.0000 mg | ORAL_TABLET | ORAL | Status: DC | PRN

## 2013-07-23 MED ORDER — RIVAROXABAN 20 MG PO TABS
20.0000 mg | ORAL_TABLET | Freq: Every day | ORAL | Status: DC
  Administered 2013-07-23: 20 mg via ORAL
  Filled 2013-07-23: qty 1

## 2013-07-23 MED ORDER — RIVAROXABAN 20 MG PO TABS: 20.0000 mg | ORAL_TABLET | Freq: Every day | ORAL | Status: DC

## 2013-07-23 MED ORDER — DILTIAZEM HCL ER COATED BEADS 240 MG PO CP24
240.0000 mg | ORAL_CAPSULE | Freq: Every day | ORAL | Status: DC
  Administered 2013-07-23: 240 mg via ORAL
  Filled 2013-07-23: qty 1

## 2013-07-23 MED ORDER — DILTIAZEM HCL ER COATED BEADS 240 MG PO CP24: 240.0000 mg | ORAL_CAPSULE | Freq: Every day | ORAL | Status: DC

## 2013-07-23 NOTE — Progress Notes (Signed)
Subjective: Breathing is good  No CP  Objective: Filed Vitals:   07/23/13 0000 07/23/13 0400 07/23/13 0526 07/23/13 0746  BP:  108/39  127/70  Pulse: 80 71  87  Temp:  98.2 F (36.8 C)  97.9 F (36.6 C)  TempSrc:  Oral  Oral  Resp:    20  Height:   5\' 7"  (1.702 m)   Weight:   255 lb 15.3 oz (116.1 kg)   SpO2:  99%  100%   Weight change: -0.7 oz (-0.021 kg)  Intake/Output Summary (Last 24 hours) at 07/23/13 0955 Last data filed at 07/23/13 0800  Gross per 24 hour  Intake    916 ml  Output    500 ml  Net    416 ml    General: Alert, awake, oriented x3, in no acute distress Neck:  JVP is normal Heart: Regular rate and rhythm, without murmurs, rubs, gallops.  Lungs: Clear to auscultation.  No rales or wheezes. Exemities:  No edema.   Neuro: Grossly intact, nonfocal.   Lab Results: Results for orders placed during the hospital encounter of 07/21/13 (from the past 24 hour(s))  HEPARIN LEVEL (UNFRACTIONATED)     Status: None   Collection Time    07/22/13 11:40 AM      Result Value Ref Range   Heparin Unfractionated 0.38  0.30 - 0.70 IU/mL  HEPARIN LEVEL (UNFRACTIONATED)     Status: None   Collection Time    07/23/13  3:05 AM      Result Value Ref Range   Heparin Unfractionated 0.38  0.30 - 0.70 IU/mL  CBC     Status: Abnormal   Collection Time    07/23/13  3:05 AM      Result Value Ref Range   WBC 7.1  4.0 - 10.5 K/uL   RBC 3.66 (*) 3.87 - 5.11 MIL/uL   Hemoglobin 10.1 (*) 12.0 - 15.0 g/dL   HCT 32.5 (*) 36.0 - 46.0 %   MCV 88.8  78.0 - 100.0 fL   MCH 27.6  26.0 - 34.0 pg   MCHC 31.1  30.0 - 36.0 g/dL   RDW 13.6  11.5 - 15.5 %   Platelets 257  150 - 400 K/uL    Studies/Results: No results found.  Medications: Reviewed   @PROBHOSP @  1.  Atrial fib  S/p TEE cardioversion yesterday.    Note moderate MR on TEE  LVEF was normal .  Will look into switching to Xarelto   2  MR  WIll need to be followed.     2.  HTN  Good control    LOS: 2 days   Dorris Carnes 07/23/2013, 9:55 AM  Dorris Carnes

## 2013-07-23 NOTE — Progress Notes (Signed)
ANTICOAGULATION CONSULT NOTE - Follow Up Consult  Pharmacy Consult for heparin Indication: atrial fibrillation   Labs:  Recent Labs  07/21/13 1005 07/21/13 1840  07/21/13 2315 07/22/13 0526 07/22/13 0529 07/22/13 1140 07/23/13 0305  HGB 12.3  --   --   --   --  11.6*  --  10.1*  HCT 37.3  --   --   --   --  36.5  --  32.5*  PLT 302  --   --   --   --  272  --  257  HEPARINUNFRC  --   --   < > 0.36  --  0.28* 0.38 0.38  CREATININE 1.08  --   --   --   --  0.73  --   --   TROPONINI <0.30 <0.30  --  <0.30 <0.30  --   --   --   < > = values in this interval not displayed.  Assessment: 63 y.o. female who presented on 7/16 with new onset afib. Currently therapeutic on heparin, H/H slight decrease, plt wnl, no s/sx of bleeding. Pt now s/p successful DCCV and TEE on 7/17 with no evidence of thrombus. CHADS-VASc 2.   Goal of Therapy:  Heparin level 0.3-0.7 units/ml   Plan:  - Continue heparin 1,400 units/hour - Daily heparin level and CBC - F/u plans to continue heparin   Harolyn Rutherford, PharmD Clinical Pharmacist - Resident Pager: [phone] Pharmacy: [phone] 07/23/2013 9:44 AM

## 2013-07-23 NOTE — Discharge Instructions (Signed)
Atrial Fibrillation Atrial fibrillation is a condition that causes your heart to beat irregularly. It may also cause your heart to beat faster than normal. Atrial fibrillation can prevent your heart from pumping blood normally. It increases your risk of stroke and heart problems. HOME CARE  Take medications as told by your doctor.  Only take medications that your doctor says are safe. Some medications can make the condition worse or happen again.  If blood thinners were prescribed by your doctor, take them exactly as told. Too much can cause bleeding. Too little and you will not have the needed protection against stroke and other problems.  Perform blood tests at home if told by your doctor.  Perform blood tests exactly as told by your doctor.  Do not drink alcohol.  Do not drink beverages with caffeine such as coffee, soda, and some teas.  Maintain a healthy weight.  Do not use diet pills unless your doctor says they are safe. They may make heart problems worse.  Follow diet instructions as told by your doctor.  Exercise regularly as told by your doctor.  Keep all follow-up appointments. GET HELP RIGHT AWAY IF:   You have chest or belly (abdominal) pain.  You feel sick to your stomach (nauseous)  You suddenly have swollen feet and ankles.  You feel dizzy.  You face, arms, or legs feel numb or weak.  There is a change in your vision or speech.  You notice a change in the speed, rhythm, or strength of your heartbeat.  You suddenly begin peeing (urinating) more often.  You get tired more easily when moving or exercising. MAKE SURE YOU:   Understand these instructions.  Will watch your condition.  Will get help right away if you are not doing well or get worse. Document Released: 10/02/2007 Document Revised: 04/19/2012 Document Reviewed: 02/03/2012 [facility] Patient Information 2015 [place], [place]. This information is not intended to replace advice given to you by  your health care provider. Make sure you discuss any questions you have with your health care provider. Rivaroxaban oral tablets What is this medicine? RIVAROXABAN (ri va ROX a ban) is an anticoagulant (blood thinner). It is used to treat blood clots in the lungs or in the veins. It is also used after knee or hip surgeries to prevent blood clots. It is also used to lower the chance of stroke in people with a medical condition called atrial fibrillation. This medicine may be used for other purposes; ask your health care provider or pharmacist if you have questions. COMMON BRAND NAME(S): Xarelto, Xarelto Starter Pack What should I tell my health care provider before I take this medicine? They need to know if you have any of these conditions: -bleeding disorders -bleeding in the brain -blood in your stools (black or tarry stools) or if you have blood in your vomit -history of stomach bleeding -kidney disease -liver disease -low blood counts, like low white cell, platelet, or red cell counts -recent or planned spinal or epidural procedure -take medicines that treat or prevent blood clots -an unusual or allergic reaction to rivaroxaban, other medicines, foods, dyes, or preservatives -pregnant or trying to get pregnant -breast-feeding How should I use this medicine? Take this medicine by mouth with a glass of water. Follow the directions on the prescription label. Take your medicine at regular intervals. Do not take it more often than directed. Do not stop taking except on your doctor's advice. Stopping this medicine may increase your risk of a blot  clot. Be sure to refill your prescription before you run out of medicine. If you are taking this medicine after hip or knee replacement surgery, take it with or without food. If you are taking this medicine for atrial fibrillation, take it with your evening meal. If you are taking this medicine to treat blood clots, take it with food at the same time each  day. If you are unable to swallow your tablet, you may crush the tablet and mix it in applesauce. Then, immediately eat the applesauce. You should eat more food right after you eat the applesauce containing the crushed tablet. Talk to your pediatrician regarding the use of this medicine in children. Special care may be needed. Overdosage: If you think you have taken too much of this medicine contact a poison control center or emergency room at once. NOTE: This medicine is only for you. Do not share this medicine with others. What if I miss a dose? If you take your medicine once a day and miss a dose, take the missed dose as soon as you remember. If you take your medicine twice a day and miss a dose, take the missed dose immediately. In this instance, 2 tablets may be taken at the same time. The next day you should take 1 tablet twice a day as directed. What may interact with this medicine? -aspirin and aspirin-like medicines -certain antibiotics like erythromycin, azithromycin, and clarithromycin -certain medicines for fungal infections like ketoconazole and itraconazole -certain medicines for irregular heart beat like amiodarone, quinidine, dronedarone -certain medicines for seizures like carbamazepine, phenytoin -certain medicines that treat or prevent blood clots like warfarin, enoxaparin, and dalteparin -conivaptan -diltiazem -felodipine -indinavir -lopinavir; ritonavir -NSAIDS, medicines for pain and inflammation, like ibuprofen or naproxen -ranolazine -rifampin -ritonavir -St. John's wort -verapamil This list may not describe all possible interactions. Give your health care provider a list of all the medicines, herbs, non-prescription drugs, or dietary supplements you use. Also tell them if you smoke, drink alcohol, or use illegal drugs. Some items may interact with your medicine. What should I watch for while using this medicine? Visit your doctor or health care professional for  regular checks on your progress. Your condition will be monitored carefully while you are receiving this medicine. Notify your doctor or health care professional and seek emergency treatment if you develop breathing problems; changes in vision; chest pain; severe, sudden headache; pain, swelling, warmth in the leg; trouble speaking; sudden numbness or weakness of the face, arm, or leg. These can be signs that your condition has gotten worse. If you are going to have surgery, tell your doctor or health care professional that you are taking this medicine. Tell your health care professional that you use this medicine before you have a spinal or epidural procedure. Sometimes people who take this medicine have bleeding problems around the spine when they have a spinal or epidural procedure. This bleeding is very rare. If you have a spinal or epidural procedure while on this medicine, call your health care professional immediately if you have back pain, numbness or tingling (especially in your legs and feet), muscle weakness, paralysis, or loss of bladder or bowel control. Avoid sports and activities that might cause injury while you are using this medicine. Severe falls or injuries can cause unseen bleeding. Be careful when using sharp tools or knives. Consider using an Copy. Take special care brushing or flossing your teeth. Report any injuries, bruising, or red spots on the skin to your doctor  or health care professional. What side effects may I notice from receiving this medicine? Side effects that you should report to your doctor or health care professional as soon as possible: -allergic reactions like skin rash, itching or hives, swelling of the face, lips, or tongue -back pain -redness, blistering, peeling or loosening of the skin, including inside the mouth -signs and symptoms of bleeding such as bloody or black, tarry stools; red or dark-brown urine; spitting up blood or brown material that  looks like coffee grounds; red spots on the skin; unusual bruising or bleeding from the eye, gums, or nose Side effects that usually do not require medical attention (Report these to your doctor or health care professional if they continue or are bothersome.): -dizziness -muscle pain This list may not describe all possible side effects. Call your doctor for medical advice about side effects. You may report side effects to FDA at 1-800-FDA-1088. Where should I keep my medicine? Keep out of the reach of children. Store at room temperature between 15 and 30 degrees C (59 and 86 degrees F). Throw away any unused medicine after the expiration date. NOTE: This sheet is a summary. It may not cover all possible information. If you have questions about this medicine, talk to your doctor, pharmacist, or health care provider.  2015, Elsevier/Gold Standard. (2012-06-09 09:51:31)    Information on my medicine - XARELTO (Rivaroxaban)  This medication education was reviewed with me or my healthcare representative as part of my discharge preparation.  The pharmacist that spoke with me during my hospital stay was:  Von Nils Flack, RPH  Why was Xarelto prescribed for you? Xarelto was prescribed for you to reduce the risk of a blood clot forming that can cause a stroke if you have a medical condition called atrial fibrillation (a type of irregular heartbeat).  What do you need to know about xarelto ? Take your Xarelto ONCE DAILY at the same time every day with your evening meal. If you have difficulty swallowing the tablet whole, you may crush it and mix in applesauce just prior to taking your dose.  Take Xarelto exactly as prescribed by your doctor and DO NOT stop taking Xarelto without talking to the doctor who prescribed the medication.  Stopping without other stroke prevention medication to take the place of Xarelto may increase your risk of developing a clot that causes a stroke.  Refill your  prescription before you run out.  After discharge, you should have regular check-up appointments with your healthcare provider that is prescribing your Xarelto.  In the future your dose may need to be changed if your kidney function or weight changes by a significant amount.  What do you do if you miss a dose? If you are taking Xarelto ONCE DAILY and you miss a dose, take it as soon as you remember on the same day then continue your regularly scheduled once daily regimen the next day. Do not take two doses of Xarelto at the same time or on the same day.   Important Safety Information A possible side effect of Xarelto is bleeding. You should call your healthcare provider right away if you experience any of the following:   Bleeding from an injury or your nose that does not stop.   Unusual colored urine (red or dark brown) or unusual colored stools (red or black).   Unusual bruising for unknown reasons.   A serious fall or if you hit your head (even if there is no  bleeding).  Some medicines may interact with Xarelto and might increase your risk of bleeding while on Xarelto. To help avoid this, consult your healthcare provider or pharmacist prior to using any new prescription or non-prescription medications, including herbals, vitamins, non-steroidal anti-inflammatory drugs (NSAIDs) and supplements.  This website has more information on Xarelto: [email or web address]://guerra-benson.com/.

## 2013-07-25 ENCOUNTER — Encounter (HOSPITAL_COMMUNITY): Payer: Self-pay | Admitting: Internal Medicine

## 2013-07-27 ENCOUNTER — Ambulatory Visit (INDEPENDENT_AMBULATORY_CARE_PROVIDER_SITE_OTHER): Payer: BC Managed Care – PPO | Admitting: Physician Assistant

## 2013-07-27 ENCOUNTER — Encounter: Payer: Self-pay | Admitting: *Deleted

## 2013-07-27 ENCOUNTER — Encounter: Payer: Self-pay | Admitting: Physician Assistant

## 2013-07-27 VITALS — BP 110/68 | HR 82 | Ht 67.0 in | Wt 256.0 lb

## 2013-07-27 DIAGNOSIS — I4891 Unspecified atrial fibrillation: Secondary | ICD-10-CM

## 2013-07-27 DIAGNOSIS — I059 Rheumatic mitral valve disease, unspecified: Secondary | ICD-10-CM

## 2013-07-27 DIAGNOSIS — I340 Nonrheumatic mitral (valve) insufficiency: Secondary | ICD-10-CM

## 2013-07-27 DIAGNOSIS — I10 Essential (primary) hypertension: Secondary | ICD-10-CM

## 2013-07-27 NOTE — Addendum Note (Signed)
Addended by: Hilarie Fredrickson T on: 07/27/2013 02:10 PM   Modules accepted: Orders

## 2013-07-27 NOTE — Assessment & Plan Note (Signed)
Patient had moderate MR on TEE. We'll need to be followed.

## 2013-07-27 NOTE — Assessment & Plan Note (Signed)
Controlled.  

## 2013-07-27 NOTE — Progress Notes (Signed)
HPI: This is a 63 year old female patient of Dr.Varanasi who presented to the emergency room with new onset atrial fibrillation. She underwent DC cardioversion on 07/22/13 by Dr. Harrington Challenger. She was sent home on Xarelto and diltiazem. She had moderate MR with normal LV function on 2-D echo. TSH was borderline high of 4.56.  Patient is doing very well since cardioversion. She mowed her lawn this morning and has been canning tomatoes all morning. She denies any further chest nausea, palpitations, dizziness or presyncope.  No Known Allergies  Current Outpatient Prescriptions on File Prior to Visit: acetaminophen (TYLENOL) 325 MG tablet, Take 2 tablets (650 mg total) by mouth every 4 (four) hours as needed for headache or mild pain., Disp: , Rfl:  diltiazem (CARDIZEM CD) 240 MG 24 hr capsule, Take 1 capsule (240 mg total) by mouth daily., Disp: 30 capsule, Rfl: 11 lisinopril-hydrochlorothiazide (PRINZIDE,ZESTORETIC) 20-12.5 MG per tablet, Take 1 tablet by mouth daily., Disp: , Rfl:  rivaroxaban (XARELTO) 20 MG TABS tablet, Take 1 tablet (20 mg total) by mouth daily with supper., Disp: 30 tablet, Rfl: 11  No current facility-administered medications on file prior to visit.   Past Medical History:   Hypertension                                                 High cholesterol                                             Dysrhythmia                                                    Comment:atrial fib 2015  Past Surgical History:   TUBAL LIGATION                                                TEE WITHOUT CARDIOVERSION                       N/A 07/22/2013      Comment:Procedure: TRANSESOPHAGEAL ECHOCARDIOGRAM               (TEE);  Surgeon: Fay Records, MD;  Location:               [facility] ENDOSCOPY;  Service: Cardiovascular;                Laterality: N/A;   CARDIOVERSION                                   N/A 07/22/2013      Comment:Procedure: CARDIOVERSION;  Surgeon: Fay Records, MD;   Location: [place];  Service:               Cardiovascular;  Laterality: N/A;  No family history on file.  Social History   Marital Status: Divorced            Spouse Name:                      Years of Education:                 Number of children:             Occupational History   None on file  Social History Main Topics   Smoking Status: Never Smoker                     Smokeless Status: Not on file                      Alcohol Use: No             Drug Use: No             Sexual Activity: Not on file        Other Topics            Concern   None on file  Social History Narrative   None on file    ROS: See history of present illness otherwise negative   PHYSICAL EXAM: Well-nournished, in no acute distress. Neck: Murmur portrayed in carotids No JVD, HJR, Bruit, or thyroid enlargement  Lungs: No tachypnea, clear without wheezing, rales, or rhonchi  Cardiovascular: RRR, PMI not displaced, 2/6 systolic murmur at the right sternal border and apex, no gallops, bruit, thrill, or heave.  Abdomen: BS normal. Soft without organomegaly, masses, lesions or tenderness.  Extremities: without cyanosis, clubbing or edema. Good distal pulses bilateral  SKin: Warm, no lesions or rashes   Musculoskeletal: No deformities  Neuro: no focal signs  BP 110/68  Pulse 82  Ht 5\' 7"  (1.702 m)  Wt 256 lb (116.121 kg)  BMI 40.09 kg/m2   EKG: Normal sinus rhythm  2-D echo 07/22/13 Left ventricle: No evidence of thrombus. - Left atrium: No evidence of thrombus in the atrial cavity or   appendage. No evidence of thrombus in the atrial cavity or   appendage.  Impressions:  - Successful cardioversion. No cardiac source of emboli was   indentified.

## 2013-07-27 NOTE — Assessment & Plan Note (Addendum)
Patient converted to normal sinus rhythm with DC cardioversion on 07/22/13. She is maintaining normal sinus rhythm. She is doing quite well on Xarelto and diltiazem. Continue these medications and followup with Dr. Harl Bowie or Dr. Bronson Ing in 2 months. TSH was borderline at 4.56. Recommend following up with primary M.D.

## 2013-07-27 NOTE — Patient Instructions (Signed)
Your physician recommends that you schedule a follow-up appointment in: 2 months    Your physician recommends that you continue on your current medications as directed. Please refer to the Current Medication list given to you today.  Please call and make an Appointment with Dr Hilma Favors.

## 2013-08-22 NOTE — Discharge Summary (Addendum)
     Patient ID: Wendy Moran,  MRN: [id number], DOB/AGE: 1950-04-18 63 y.o.  Admit date: 07/21/2013 Discharge date: 07/23/13  Primary Care Provider: Primary Cardiologist: Dr Harl Bowie or Dr Bronson Ing in [place] (new- formerly Dr Mathis Bud)  Discharge Diagnoses Active Problems:   Atrial fibrillation  Obesity (BMI >40)  Procedures:  TEE/CV 07/22/13   Hospital Course:  63 year old female, [name], who presented to the emergency room with new onset atrial fibrillation. She was admitted, started on IV Diltiazem and Heparin. She underwent DC cardioversion on 07/22/13 by Dr. Harrington Challenger with conversion to NSR. She was sent home on Xarelto and diltiazem. She had moderate MR with normal LV function on 2-D echo. TSH was borderline high of 4.56. She will follow up in the office.    Discharge Vitals:  Blood pressure 141/87, pulse 87, temperature 97.9 F (36.6 C), temperature source Oral, resp. rate 15, height 5\' 7"  (1.702 m), weight 255 lb 15.3 oz (116.1 kg), SpO2 100.00%.    Labs: No results found for this or any previous visit (from the past 24 hour(s)).  Disposition:  Follow-up Information   Follow up with Arnoldo Lenis, MD. (office will call you)    Specialty:  Cardiology   Contact information:   [address] [place] [address] [phone]       Discharge Medications:    Medication List         acetaminophen 325 MG tablet  Commonly known as:  TYLENOL  Take 2 tablets (650 mg total) by mouth every 4 (four) hours as needed for headache or mild pain.     diltiazem 240 MG 24 hr capsule  Commonly known as:  CARDIZEM CD  Take 1 capsule (240 mg total) by mouth daily.     lisinopril-hydrochlorothiazide 20-12.5 MG per tablet  Commonly known as:  PRINZIDE,ZESTORETIC  Take 1 tablet by mouth daily.     rivaroxaban 20 MG Tabs tablet  Commonly known as:  XARELTO  Take 1 tablet (20 mg total) by mouth daily with supper.         Duration of Discharge  Encounter: Greater than 30 minutes including physician time.  Signed, Kerin Ransom PA-C 08/22/2013 1:13 PM  I have  reviewed the assessment and plan.  Agree with above as stated.  S/p DCCV for new onset atrial fibrillation.  VARANASI,JAYADEEP S.

## 2013-09-16 ENCOUNTER — Encounter: Payer: Self-pay | Admitting: Cardiology

## 2013-09-16 ENCOUNTER — Ambulatory Visit (INDEPENDENT_AMBULATORY_CARE_PROVIDER_SITE_OTHER): Payer: BC Managed Care – PPO | Admitting: Cardiology

## 2013-09-16 VITALS — BP 116/68 | HR 62 | Ht 67.0 in | Wt 247.0 lb

## 2013-09-16 DIAGNOSIS — I10 Essential (primary) hypertension: Secondary | ICD-10-CM

## 2013-09-16 DIAGNOSIS — I059 Rheumatic mitral valve disease, unspecified: Secondary | ICD-10-CM

## 2013-09-16 DIAGNOSIS — I340 Nonrheumatic mitral (valve) insufficiency: Secondary | ICD-10-CM

## 2013-09-16 DIAGNOSIS — I4891 Unspecified atrial fibrillation: Secondary | ICD-10-CM

## 2013-09-16 NOTE — Progress Notes (Signed)
Clinical Summary Wendy Moran is a 63 y.o.female last seen by PA Lenze, this is our first visit together. She is seen for the following medical problems.  1. Afib - s/p DCCV 07/2013 - on xarelto and diltiazem. Denies any bleeding issues on xarelto - denies any palpitations - she reports prior negative sleep study  2. Mitral regurgitation - moderate by TEE 07/2013 - denies any symptoms  3. HTN - does not check bp regularly - compliant with meds    Past Medical History  Diagnosis Date  . Hypertension   . High cholesterol   . Dysrhythmia     atrial fib 2015     No Known Allergies   Current Outpatient Prescriptions  Medication Sig Dispense Refill  . acetaminophen (TYLENOL) 325 MG tablet Take 2 tablets (650 mg total) by mouth every 4 (four) hours as needed for headache or mild pain.      Marland Kitchen diltiazem (CARDIZEM CD) 240 MG 24 hr capsule Take 1 capsule (240 mg total) by mouth daily.  30 capsule  11  . lisinopril-hydrochlorothiazide (PRINZIDE,ZESTORETIC) 20-12.5 MG per tablet Take 1 tablet by mouth daily.      . rivaroxaban (XARELTO) 20 MG TABS tablet Take 1 tablet (20 mg total) by mouth daily with supper.  30 tablet  11   No current facility-administered medications for this visit.     Past Surgical History  Procedure Laterality Date  . Tubal ligation    . Tee without cardioversion N/A 07/22/2013    Procedure: TRANSESOPHAGEAL ECHOCARDIOGRAM (TEE);  Surgeon: Fay Records, MD;  Location: [facility] ENDOSCOPY;  Service: Cardiovascular;  Laterality: N/A;  . Cardioversion N/A 07/22/2013    Procedure: CARDIOVERSION;  Surgeon: Fay Records, MD;  Location: [facility] ENDOSCOPY;  Service: Cardiovascular;  Laterality: N/A;     No Known Allergies    No family history on file.   Social History Ms. Thoennes reports that she has never smoked. She does not have any smokeless tobacco history on file. Ms. Helbert reports that she does not drink alcohol.   Review of  Systems CONSTITUTIONAL: No weight loss, fever, chills, weakness or fatigue.  HEENT: Eyes: No visual loss, blurred vision, double vision or yellow sclerae.No hearing loss, sneezing, congestion, runny nose or sore throat.  SKIN: No rash or itching.  CARDIOVASCULAR: per HPI RESPIRATORY: No shortness of breath, cough or sputum.  GASTROINTESTINAL: No anorexia, nausea, vomiting or diarrhea. No abdominal pain or blood.  GENITOURINARY: No burning on urination, no polyuria NEUROLOGICAL: No headache, dizziness, syncope, paralysis, ataxia, numbness or tingling in the extremities. No change in bowel or bladder control.  MUSCULOSKELETAL: No muscle, back pain, joint pain or stiffness.  LYMPHATICS: No enlarged nodes. No history of splenectomy.  PSYCHIATRIC: No history of depression or anxiety.  ENDOCRINOLOGIC: No reports of sweating, cold or heat intolerance. No polyuria or polydipsia.  Marland Kitchen   Physical Examination p 62 bp 116/68 Wt 247 lbs BMI 39 Gen: resting comfortably, no acute distress HEENT: no scleral icterus, pupils equal round and reactive, no palptable cervical adenopathy,  CV: RRR, decrease heart sounds due to body habitus, no m/r/g, no JVD, no carotid bruits Resp: Clear to auscultation bilaterally GI: abdomen is soft, non-tender, non-distended, normal bowel sounds, no hepatosplenomegaly MSK: extremities are warm, no edema.  Skin: warm, no rash Neuro:  no focal deficits Psych: appropriate affect   Diagnostic Studies 07/2013 TEE Study Conclusions  - Left ventricle: No evidence of thrombus. - Left atrium: No evidence of thrombus  in the atrial cavity or appendage. No evidence of thrombus in the atrial cavity or appendage.  Impressions:  - Successful cardioversion. No cardiac source of emboli was indentified.  Left ventricle: LVEF is normal. No evidence of thrombus.  ------------------------------------------------------------------- Aortic valve: AV is normal. No  AI.  ------------------------------------------------------------------- Mitral valve: MV is normal. There is moderate MR.  ------------------------------------------------------------------- Left atrium: No evidence of thrombus in the atrial cavity or appendage. No evidence of thrombus in the atrial cavity or appendage.  ------------------------------------------------------------------- Right ventricle: RVEF is normal.  ------------------------------------------------------------------- Tricuspid valve: TV is normal. MIld TR.     Assessment and Plan  1. Afib - no current symptoms - continue current meds  2. Mitral regurgitation - moderate by recent TEE, no current symptoms - continue to follow   3. HTN - at goal, continue current meds   F/u 1 year      Arnoldo Lenis, M.D., F.A.C.C.

## 2013-09-16 NOTE — Patient Instructions (Signed)
Your physician wants you to follow-up in: 1 year You will receive a reminder letter in the mail two months in advance. If you don't receive a letter, please call our office to schedule the follow-up appointment.    Your physician recommends that you continue on your current medications as directed. Please refer to the Current Medication list given to you today.     Thank you for choosing [facility] !  

## 2014-01-09 ENCOUNTER — Other Ambulatory Visit (HOSPITAL_COMMUNITY): Payer: Self-pay | Admitting: Family Medicine

## 2014-01-09 DIAGNOSIS — Z1231 Encounter for screening mammogram for malignant neoplasm of breast: Secondary | ICD-10-CM

## 2014-01-13 ENCOUNTER — Ambulatory Visit (HOSPITAL_COMMUNITY): Payer: Self-pay

## 2014-01-16 ENCOUNTER — Encounter: Payer: Self-pay | Admitting: Internal Medicine

## 2014-01-23 ENCOUNTER — Ambulatory Visit (HOSPITAL_COMMUNITY)
Admission: RE | Admit: 2014-01-23 | Discharge: 2014-01-23 | Disposition: A | Discharge status: Home / Self Care | Payer: BLUE CROSS/BLUE SHIELD | Source: Ambulatory Visit | Attending: Family Medicine | Admitting: Family Medicine

## 2014-01-23 DIAGNOSIS — Z1231 Encounter for screening mammogram for malignant neoplasm of breast: Secondary | ICD-10-CM | POA: Diagnosis not present

## 2014-02-13 ENCOUNTER — Ambulatory Visit: Payer: Self-pay | Admitting: Gastroenterology

## 2014-02-22 ENCOUNTER — Ambulatory Visit: Payer: Self-pay | Admitting: Gastroenterology

## 2014-02-23 ENCOUNTER — Ambulatory Visit: Payer: Self-pay | Admitting: Gastroenterology

## 2014-10-31 ENCOUNTER — Other Ambulatory Visit: Payer: Self-pay | Admitting: Cardiology

## 2014-10-31 NOTE — Telephone Encounter (Signed)
Pt is almost out of her medications and has an apt w/ [facility] on 11/14

## 2014-10-31 NOTE — Telephone Encounter (Signed)
Please specify which medications and the dosage when requesting a med refill to avoid an unnecessary phone call and delay.

## 2014-11-01 ENCOUNTER — Other Ambulatory Visit: Payer: Self-pay

## 2014-11-01 MED ORDER — DILTIAZEM HCL ER COATED BEADS 240 MG PO CP24: 240.0000 mg | ORAL_CAPSULE | Freq: Every day | ORAL | Status: DC

## 2014-11-01 MED ORDER — RIVAROXABAN 20 MG PO TABS: 20.0000 mg | ORAL_TABLET | Freq: Every day | ORAL | Status: DC

## 2014-11-01 NOTE — Telephone Encounter (Signed)
Called pt to see which medications she needed refills on. Sent in refills to [facility] in [place].

## 2014-11-01 NOTE — Telephone Encounter (Signed)
Patient needs medication called in before appt.  She has 3 pills left for each medication

## 2014-11-20 ENCOUNTER — Encounter: Payer: Self-pay | Admitting: *Deleted

## 2014-11-20 ENCOUNTER — Ambulatory Visit (INDEPENDENT_AMBULATORY_CARE_PROVIDER_SITE_OTHER): Payer: BLUE CROSS/BLUE SHIELD | Admitting: Cardiology

## 2014-11-20 VITALS — BP 124/76 | HR 76 | Ht 67.0 in | Wt 242.0 lb

## 2014-11-20 DIAGNOSIS — I4891 Unspecified atrial fibrillation: Secondary | ICD-10-CM

## 2014-11-20 DIAGNOSIS — I340 Nonrheumatic mitral (valve) insufficiency: Secondary | ICD-10-CM

## 2014-11-20 MED ORDER — DILTIAZEM HCL ER COATED BEADS 240 MG PO CP24: 240.0000 mg | ORAL_CAPSULE | Freq: Every day | ORAL | Status: DC

## 2014-11-20 MED ORDER — RIVAROXABAN 20 MG PO TABS: 20.0000 mg | ORAL_TABLET | Freq: Every day | ORAL | Status: DC

## 2014-11-20 NOTE — Progress Notes (Signed)
Patient ID: Wendy Moran, female   DOB: Jan 03, 1951, 64 y.o.   MRN: [id number]     Clinical Summary Wendy Moran is a 64 y.o.female seen today for follow up of the following medical problems.   1. Afib - s/p DCCV 07/2013 - on xarelto and diltiazem.  - she reports prior negative sleep study  - reports some mixed compliance with meds.Over that time had increased palpitations. She is more compliant now and symptoms have resolved.  - no bleeding troubles of xarelto   2. Mitral regurgitation - moderate by TEE 07/2013 - denies any SOB, no DOE, no LE edema.   3. HTN - does not check bp regularly - compliant with meds   SH: has 62 yo yorky named Wendy Moran Kitchen  Past Medical History  Diagnosis Date  . Hypertension   . High cholesterol   . Dysrhythmia     atrial fib 2015     No Known Allergies   Current Outpatient Prescriptions  Medication Sig Dispense Refill  . acetaminophen (TYLENOL) 325 MG tablet Take 2 tablets (650 mg total) by mouth every 4 (four) hours as needed for headache or mild pain.    Wendy Moran Kitchen diltiazem (CARDIZEM CD) 240 MG 24 hr capsule Take 1 capsule (240 mg total) by mouth daily. 30 capsule 11  . lisinopril-hydrochlorothiazide (PRINZIDE,ZESTORETIC) 20-12.5 MG per tablet Take 1 tablet by mouth daily.    . rivaroxaban (XARELTO) 20 MG TABS tablet Take 1 tablet (20 mg total) by mouth daily with supper. 30 tablet 11  . zolpidem (AMBIEN) 10 MG tablet Take 10 mg by mouth at bedtime as needed for sleep.     No current facility-administered medications for this visit.     Past Surgical History  Procedure Laterality Date  . Tubal ligation    . Tee without cardioversion N/A 07/22/2013    Procedure: TRANSESOPHAGEAL ECHOCARDIOGRAM (TEE);  Surgeon: Fay Records, MD;  Location: [facility] ENDOSCOPY;  Service: Cardiovascular;  Laterality: N/A;  . Cardioversion N/A 07/22/2013    Procedure: CARDIOVERSION;  Surgeon: Fay Records, MD;  Location: [facility];  Service: Cardiovascular;   Laterality: N/A;     No Known Allergies    Family History  Problem Relation Age of Onset  . Heart attack Father     x's 2     Social History Wendy Moran reports that she has never smoked. She does not have any smokeless tobacco history on file. Wendy Moran reports that she does not drink alcohol.   Review of Systems CONSTITUTIONAL: No weight loss, fever, chills, weakness or fatigue.  HEENT: Eyes: No visual loss, blurred vision, double vision or yellow sclerae.No hearing loss, sneezing, congestion, runny nose or sore throat.  SKIN: No rash or itching.  CARDIOVASCULAR: per hpi RESPIRATORY: No shortness of breath, cough or sputum.  GASTROINTESTINAL: No anorexia, nausea, vomiting or diarrhea. No abdominal pain or blood.  GENITOURINARY: No burning on urination, no polyuria NEUROLOGICAL: No headache, dizziness, syncope, paralysis, ataxia, numbness or tingling in the extremities. No change in bowel or bladder control.  MUSCULOSKELETAL: No muscle, back pain, joint pain or stiffness.  LYMPHATICS: No enlarged nodes. No history of splenectomy.  PSYCHIATRIC: No history of depression or anxiety.  ENDOCRINOLOGIC: No reports of sweating, cold or heat intolerance. No polyuria or polydipsia.  Wendy Moran Kitchen   Physical Examination Filed Vitals:   11/20/14 1557  BP: 124/76  Pulse: 76   Filed Vitals:   11/20/14 1557  Height: 5\' 7"  (1.702 m)  Weight: 242 lb (109.77 kg)  Gen: resting comfortably, no acute distress HEENT: no scleral icterus, pupils equal round and reactive, no palptable cervical adenopathy,  CV: RRR, 2/6 systolic murmur at apex, no jvd Resp: Clear to auscultation bilaterally GI: abdomen is soft, non-tender, non-distended, normal bowel sounds, no hepatosplenomegaly MSK: extremities are warm, no edema.  Skin: warm, no rash Neuro:  no focal deficits Psych: appropriate affect   Diagnostic Studies  07/2013 TEE Study Conclusions  - Left ventricle: No evidence of  thrombus. - Left atrium: No evidence of thrombus in the atrial cavity or appendage. No evidence of thrombus in the atrial cavity or appendage.  Impressions:  - Successful cardioversion. No cardiac source of emboli was indentified.  Left ventricle: LVEF is normal. No evidence of thrombus.  ------------------------------------------------------------------- Aortic valve: AV is normal. No AI.  ------------------------------------------------------------------- Mitral valve: MV is normal. There is moderate MR.  ------------------------------------------------------------------- Left atrium: No evidence of thrombus in the atrial cavity or appendage. No evidence of thrombus in the atrial cavity or appendage.  ------------------------------------------------------------------- Right ventricle: RVEF is normal.  ------------------------------------------------------------------- Tricuspid valve: TV is normal. MIld TR.       Assessment and Plan  1. Afib - no current symptoms - continue current meds  2. Mitral regurgitation - moderate by TEE last year. Will repeat TTE.   3. HTN - at goal, continue current meds    F/u 1 year   Arnoldo Lenis, M.D.

## 2014-11-20 NOTE — Patient Instructions (Signed)
Your physician wants you to follow-up in: 1 year with DR. BRANCH You will receive a reminder letter in the mail two months in advance. If you don't receive a letter, please call our office to schedule the follow-up appointment.  Your physician recommends that you continue on your current medications as directed. Please refer to the Current Medication list given to you today.  Your physician has requested that you have an echocardiogram. Echocardiography is a painless test that uses sound waves to create images of your heart. It provides your doctor with information about the size and shape of your heart and how well your heart's chambers and valves are working. This procedure takes approximately one hour. There are no restrictions for this procedure.  Thank you for choosing [facility]!!

## 2014-12-14 ENCOUNTER — Other Ambulatory Visit: Payer: Self-pay

## 2014-12-14 ENCOUNTER — Ambulatory Visit (INDEPENDENT_AMBULATORY_CARE_PROVIDER_SITE_OTHER): Payer: BLUE CROSS/BLUE SHIELD

## 2014-12-14 DIAGNOSIS — I340 Nonrheumatic mitral (valve) insufficiency: Secondary | ICD-10-CM | POA: Diagnosis not present

## 2014-12-19 ENCOUNTER — Telehealth: Payer: Self-pay | Admitting: *Deleted

## 2014-12-19 NOTE — Telephone Encounter (Signed)
-----   Message from Laurine Blazer, LPN sent at [id number] 10:29 AM EST -----   ----- Message -----    From: Arnoldo Lenis, MD    Sent: 12/15/2014  12:29 PM      To: Laurine Blazer, LPN  Echo looks good, the valve that was somewhat leaky before actually has improved and is only very mildly leaky at this time, which is not a concern.    Zandra Abts MD

## 2014-12-19 NOTE — Telephone Encounter (Signed)
Pt aware, routed to pcp 

## 2014-12-20 ENCOUNTER — Other Ambulatory Visit: Payer: BLUE CROSS/BLUE SHIELD

## 2015-01-02 ENCOUNTER — Other Ambulatory Visit (HOSPITAL_COMMUNITY): Payer: Self-pay | Admitting: Family Medicine

## 2015-01-02 DIAGNOSIS — Z1231 Encounter for screening mammogram for malignant neoplasm of breast: Secondary | ICD-10-CM

## 2015-01-29 ENCOUNTER — Ambulatory Visit (HOSPITAL_COMMUNITY)
Admission: RE | Admit: 2015-01-29 | Discharge: 2015-01-29 | Disposition: A | Discharge status: Home / Self Care | Payer: BLUE CROSS/BLUE SHIELD | Source: Ambulatory Visit | Attending: Family Medicine | Admitting: Family Medicine

## 2015-01-29 DIAGNOSIS — Z1231 Encounter for screening mammogram for malignant neoplasm of breast: Secondary | ICD-10-CM | POA: Insufficient documentation

## 2015-04-20 DIAGNOSIS — Z1389 Encounter for screening for other disorder: Secondary | ICD-10-CM | POA: Diagnosis not present

## 2015-04-20 DIAGNOSIS — G4700 Insomnia, unspecified: Secondary | ICD-10-CM | POA: Diagnosis not present

## 2015-04-20 DIAGNOSIS — Z23 Encounter for immunization: Secondary | ICD-10-CM | POA: Diagnosis not present

## 2015-04-20 DIAGNOSIS — Z6835 Body mass index (BMI) 35.0-35.9, adult: Secondary | ICD-10-CM | POA: Diagnosis not present

## 2015-04-20 DIAGNOSIS — I10 Essential (primary) hypertension: Secondary | ICD-10-CM | POA: Diagnosis not present

## 2015-04-20 DIAGNOSIS — I4891 Unspecified atrial fibrillation: Secondary | ICD-10-CM | POA: Diagnosis not present

## 2015-05-15 DIAGNOSIS — R079 Chest pain, unspecified: Secondary | ICD-10-CM | POA: Diagnosis not present

## 2015-05-18 ENCOUNTER — Ambulatory Visit (INDEPENDENT_AMBULATORY_CARE_PROVIDER_SITE_OTHER): Payer: BLUE CROSS/BLUE SHIELD | Admitting: Cardiology

## 2015-05-18 ENCOUNTER — Encounter: Payer: Self-pay | Admitting: *Deleted

## 2015-05-18 ENCOUNTER — Encounter: Payer: Self-pay | Admitting: Cardiology

## 2015-05-18 VITALS — BP 138/72 | HR 75 | Ht 68.0 in | Wt 249.0 lb

## 2015-05-18 DIAGNOSIS — I10 Essential (primary) hypertension: Secondary | ICD-10-CM | POA: Diagnosis not present

## 2015-05-18 DIAGNOSIS — I4891 Unspecified atrial fibrillation: Secondary | ICD-10-CM

## 2015-05-18 DIAGNOSIS — R079 Chest pain, unspecified: Secondary | ICD-10-CM | POA: Diagnosis not present

## 2015-05-18 DIAGNOSIS — I340 Nonrheumatic mitral (valve) insufficiency: Secondary | ICD-10-CM

## 2015-05-18 NOTE — Progress Notes (Signed)
Patient ID: Wendy Moran, female   DOB: 1950/06/29, 65 y.o.   MRN: [id number]     Clinical Summary Wendy Moran is a 65 y.o.female seen today for follow up of the following medical problems.   1. Afib - s/p DCCV 07/2013 - on xarelto and diltiazem.  - she reports prior negative sleep study  - no recent palpitations. No bleeding troubles on xarelto.  2. Mitral regurgitation - moderate by TEE 07/2013 - repeat TTE 12/2014 showed only mild MR  - denies any SOB, no DOE, no LE edema since last visit  3. HTN - compliant with meds   4. Chest pain - started on Monday morning. Heaviness midchest 6/10. Started while at rest. Mild dizziness, no other symptoms. Not positional. Seen in urgent care, discharged home. Pain lasted 24 hrs in total. Not positional. No relation to food. No other repeat episodes - notes some recent DOE as well. Example walking up incline at work to get to car. - reports some previous episodes chest pain over the last several weeks that were less intense.   CAD risk factors: HTN,    SH: has 63 yo yorky named Marland Kitchen Past Medical History  Diagnosis Date  . Hypertension   . High cholesterol   . Dysrhythmia     atrial fib 2015     No Known Allergies   Current Outpatient Prescriptions  Medication Sig Dispense Refill  . acetaminophen (TYLENOL) 325 MG tablet Take 2 tablets (650 mg total) by mouth every 4 (four) hours as needed for headache or mild pain.    Marland Kitchen diltiazem (CARDIZEM CD) 240 MG 24 hr capsule Take 1 capsule (240 mg total) by mouth daily. 90 capsule 3  . lisinopril-hydrochlorothiazide (PRINZIDE,ZESTORETIC) 20-12.5 MG per tablet Take 1 tablet by mouth daily.    . rivaroxaban (XARELTO) 20 MG TABS tablet Take 1 tablet (20 mg total) by mouth daily with supper. 90 tablet 3  . zolpidem (AMBIEN) 10 MG tablet Take 10 mg by mouth at bedtime as needed for sleep.     No current facility-administered medications for this visit.     Past Surgical History    Procedure Laterality Date  . Tubal ligation    . Tee without cardioversion N/A 07/22/2013    Procedure: TRANSESOPHAGEAL ECHOCARDIOGRAM (TEE);  Surgeon: Fay Records, MD;  Location: [facility] ENDOSCOPY;  Service: Cardiovascular;  Laterality: N/A;  . Cardioversion N/A 07/22/2013    Procedure: CARDIOVERSION;  Surgeon: Fay Records, MD;  Location: [facility];  Service: Cardiovascular;  Laterality: N/A;     No Known Allergies    Family History  Problem Relation Age of Onset  . Heart attack Father     x's 2     Social History Wendy Moran reports that she has never smoked. She has never used smokeless tobacco. Wendy Moran reports that she does not drink alcohol.   Review of Systems CONSTITUTIONAL: No weight loss, fever, chills, weakness or fatigue.  HEENT: Eyes: No visual loss, blurred vision, double vision or yellow sclerae.No hearing loss, sneezing, congestion, runny nose or sore throat.  SKIN: No rash or itching.  CARDIOVASCULAR: per HPI RESPIRATORY: No shortness of breath, cough or sputum.  GASTROINTESTINAL: No anorexia, nausea, vomiting or diarrhea. No abdominal pain or blood.  GENITOURINARY: No burning on urination, no polyuria NEUROLOGICAL: No headache, dizziness, syncope, paralysis, ataxia, numbness or tingling in the extremities. No change in bowel or bladder control.  MUSCULOSKELETAL: No muscle, back pain, joint pain or stiffness.  LYMPHATICS:  No enlarged nodes. No history of splenectomy.  PSYCHIATRIC: No history of depression or anxiety.  ENDOCRINOLOGIC: No reports of sweating, cold or heat intolerance. No polyuria or polydipsia.  Marland Kitchen   Physical Examination Filed Vitals:   05/18/15 0855  BP: 138/72  Pulse: 75   Filed Vitals:   05/18/15 0855  Height: 5\' 8"  (1.727 m)  Weight: 249 lb (112.946 kg)    Gen: resting comfortably, no acute distress HEENT: no scleral icterus, pupils equal round and reactive, no palptable cervical adenopathy,  CV: RRR, no m/r/g no  jvd Resp: Clear to auscultation bilaterally GI: abdomen is soft, non-tender, non-distended, normal bowel sounds, no hepatosplenomegaly MSK: extremities are warm, no edema.  Skin: warm, no rash Neuro:  no focal deficits Psych: appropriate affect   Diagnostic Studies  07/2013 TEE Study Conclusions  - Left ventricle: No evidence of thrombus. - Left atrium: No evidence of thrombus in the atrial cavity or appendage. No evidence of thrombus in the atrial cavity or appendage.  Impressions:  - Successful cardioversion. No cardiac source of emboli was indentified.  Left ventricle: LVEF is normal. No evidence of thrombus.  ------------------------------------------------------------------- Aortic valve: AV is normal. No AI.  ------------------------------------------------------------------- Mitral valve: MV is normal. There is moderate MR.  ------------------------------------------------------------------- Left atrium: No evidence of thrombus in the atrial cavity or appendage. No evidence of thrombus in the atrial cavity or appendage.  ------------------------------------------------------------------- Right ventricle: RVEF is normal.  ------------------------------------------------------------------- Tricuspid valve: TV is normal. MIld TR.    12/2014 TTE Study Conclusions  - Left ventricle: The cavity size was normal. Wall thickness was  normal. Systolic function was normal. The estimated ejection  fraction was in the range of 60% to 65%. Wall motion was normal;  there were no regional wall motion abnormalities. Left  ventricular diastolic function parameters were normal. - Aortic valve: Valve area (VTI): 3.74 cm^2. Valve area (Vmax):  3.49 cm^2. Valve area (Vmean): 3.65 cm^2. - Atrial septum: No defect or patent foramen ovale was identified. - Technially adequate study.   Assessment and Plan  1. Afib - no current symptoms - continue current meds.  CHADS2Vasc score of 2, continue xarelto.  2. Mitral regurgitation - mild by most recent TTE, no recent symptoms. Continue to monitor.   3. HTN - at goal, we will continue current meds  4. Chest pain - unclear etiology, reports some recent DOE as well. We will obtain an exercise nuclear stress   F/u pending stress results  Arnoldo Lenis, M.D.

## 2015-05-18 NOTE — Patient Instructions (Addendum)
Your physician recommends that you schedule a follow-up appointment We will call you with test results and follow.   Your physician recommends that you continue on your current medications as directed. Please refer to the Current Medication list given to you today.  Your physician has requested that you have en exercise stress myoview. For further information please visit [email or web address]. Please follow instruction sheet, as given.  Do Not take Diltiazem on the day of your Stress Test  If you need a refill on your cardiac medications before your next appointment, please call your pharmacy.  Thank you for choosing [facility]!

## 2015-05-23 ENCOUNTER — Encounter (HOSPITAL_COMMUNITY)
Admission: RE | Admit: 2015-05-23 | Discharge: 2015-05-23 | Disposition: A | Discharge status: Home / Self Care | Payer: BLUE CROSS/BLUE SHIELD | Source: Ambulatory Visit | Attending: Cardiology | Admitting: Cardiology

## 2015-05-23 ENCOUNTER — Inpatient Hospital Stay (HOSPITAL_COMMUNITY): Admission: RE | Admit: 2015-05-23 | Payer: BLUE CROSS/BLUE SHIELD | Source: Ambulatory Visit

## 2015-05-23 ENCOUNTER — Encounter (HOSPITAL_COMMUNITY): Payer: Self-pay

## 2015-05-23 DIAGNOSIS — R079 Chest pain, unspecified: Secondary | ICD-10-CM

## 2015-05-23 LAB — NM MYOCAR MULTI W/SPECT W/WALL MOTION / EF
LV dias vol: 80 mL (ref 46–106)
LV sys vol: 38 mL
Peak HR: 91 {beats}/min
RATE: 0.3
Rest HR: 67 {beats}/min
SDS: 4
SRS: 5
SSS: 9
TID: 0.83

## 2015-05-23 MED ORDER — TECHNETIUM TC 99M TETROFOSMIN IV KIT
30.0000 | PACK | Freq: Once | INTRAVENOUS | Status: AC | PRN
  Administered 2015-05-23: 30 via INTRAVENOUS

## 2015-05-23 MED ORDER — SODIUM CHLORIDE 0.9% FLUSH
INTRAVENOUS | Status: AC
  Administered 2015-05-23: 10 mL via INTRAVENOUS
  Filled 2015-05-23: qty 10

## 2015-05-23 MED ORDER — REGADENOSON 0.4 MG/5ML IV SOLN
INTRAVENOUS | Status: AC
  Administered 2015-05-23: 0.4 mg via INTRAVENOUS
  Filled 2015-05-23: qty 5

## 2015-05-23 MED ORDER — TECHNETIUM TC 99M TETROFOSMIN IV KIT
10.0000 | PACK | Freq: Once | INTRAVENOUS | Status: AC | PRN
  Administered 2015-05-23: 10.1 via INTRAVENOUS

## 2015-08-03 DIAGNOSIS — M25561 Pain in right knee: Secondary | ICD-10-CM | POA: Diagnosis not present

## 2015-08-28 DIAGNOSIS — M25561 Pain in right knee: Secondary | ICD-10-CM | POA: Diagnosis not present

## 2015-08-28 DIAGNOSIS — Z1389 Encounter for screening for other disorder: Secondary | ICD-10-CM | POA: Diagnosis not present

## 2015-08-28 DIAGNOSIS — E6609 Other obesity due to excess calories: Secondary | ICD-10-CM | POA: Diagnosis not present

## 2015-08-28 DIAGNOSIS — M1711 Unilateral primary osteoarthritis, right knee: Secondary | ICD-10-CM | POA: Diagnosis not present

## 2015-08-28 DIAGNOSIS — Z6835 Body mass index (BMI) 35.0-35.9, adult: Secondary | ICD-10-CM | POA: Diagnosis not present

## 2015-09-26 DIAGNOSIS — Z6836 Body mass index (BMI) 36.0-36.9, adult: Secondary | ICD-10-CM | POA: Diagnosis not present

## 2015-09-26 DIAGNOSIS — I10 Essential (primary) hypertension: Secondary | ICD-10-CM | POA: Diagnosis not present

## 2015-09-26 DIAGNOSIS — E6609 Other obesity due to excess calories: Secondary | ICD-10-CM | POA: Diagnosis not present

## 2015-09-26 DIAGNOSIS — Z1389 Encounter for screening for other disorder: Secondary | ICD-10-CM | POA: Diagnosis not present

## 2015-10-25 DIAGNOSIS — H524 Presbyopia: Secondary | ICD-10-CM | POA: Diagnosis not present

## 2015-10-25 DIAGNOSIS — H52222 Regular astigmatism, left eye: Secondary | ICD-10-CM | POA: Diagnosis not present

## 2015-10-25 DIAGNOSIS — H5202 Hypermetropia, left eye: Secondary | ICD-10-CM | POA: Diagnosis not present

## 2015-10-30 ENCOUNTER — Other Ambulatory Visit: Payer: Self-pay | Admitting: Cardiology

## 2015-10-30 MED ORDER — DILTIAZEM HCL ER COATED BEADS 240 MG PO CP24: 240.0000 mg | ORAL_CAPSULE | Freq: Every day | ORAL | 3 refills | Status: DC

## 2015-10-30 MED ORDER — RIVAROXABAN 20 MG PO TABS: 20.0000 mg | ORAL_TABLET | Freq: Every day | ORAL | 3 refills | Status: DC

## 2015-10-30 NOTE — Telephone Encounter (Signed)
Needs refills on Xarelto and Cartia XT sent to [facility] / tg

## 2015-10-30 NOTE — Telephone Encounter (Signed)
Refill complete 

## 2015-12-24 ENCOUNTER — Encounter: Payer: Self-pay | Admitting: *Deleted

## 2015-12-24 ENCOUNTER — Ambulatory Visit (INDEPENDENT_AMBULATORY_CARE_PROVIDER_SITE_OTHER): Payer: BLUE CROSS/BLUE SHIELD | Admitting: Cardiology

## 2015-12-24 ENCOUNTER — Encounter: Payer: Self-pay | Admitting: Cardiology

## 2015-12-24 VITALS — BP 128/75 | HR 76 | Ht 68.0 in | Wt 258.0 lb

## 2015-12-24 DIAGNOSIS — I4891 Unspecified atrial fibrillation: Secondary | ICD-10-CM

## 2015-12-24 DIAGNOSIS — R0789 Other chest pain: Secondary | ICD-10-CM | POA: Diagnosis not present

## 2015-12-24 DIAGNOSIS — I10 Essential (primary) hypertension: Secondary | ICD-10-CM | POA: Diagnosis not present

## 2015-12-24 NOTE — Progress Notes (Addendum)
Clinical Summary Wendy Moran is a 65 y.o.female seen today for follow up of the following medical problems.   1. Afib - s/p DCCV 07/2013 - on xarelto and diltiazem.  - she reports prior negative sleep study   - can feel flushed, nervous at times. Feels dizzy, can feel like heart is beating fast. Last episode a few weeks ago. Overall symptoms are infrequent.  - compliant with meds. No bleeding on xarelto  2. Mitral regurgitation - moderate by TEE 07/2013 - repeat TTE 12/2014 showed only mild MR  - no recent SOB, no DOE, no LE edema since last visit  3. HTN - compliant with meds Typically around 135-142/70-80s at home.   4. Chest pain - prior history of chest pain. She had a negative nuclear stress test earlier this year.  - no recent symptoms     SH: has 65 yo yorky named Wendy Moran. Looking for a new job.        Past Medical History:  Diagnosis Date  . Dysrhythmia    atrial fib 2015  . High cholesterol   . Hypertension      No Known Allergies   Current Outpatient Prescriptions  Medication Sig Dispense Refill  . acetaminophen (TYLENOL) 325 MG tablet Take 2 tablets (650 mg total) by mouth every 4 (four) hours as needed for headache or mild pain.    Marland Kitchen diltiazem (CARDIZEM CD) 240 MG 24 hr capsule Take 1 capsule (240 mg total) by mouth daily. 90 capsule 3  . lisinopril-hydrochlorothiazide (PRINZIDE,ZESTORETIC) 20-12.5 MG per tablet Take 1 tablet by mouth daily.    . rivaroxaban (XARELTO) 20 MG TABS tablet Take 1 tablet (20 mg total) by mouth daily with supper. 90 tablet 3   No current facility-administered medications for this visit.      Past Surgical History:  Procedure Laterality Date  . CARDIOVERSION N/A 07/22/2013   Procedure: CARDIOVERSION;  Surgeon: Fay Records, MD;  Location: [facility];  Service: Cardiovascular;  Laterality: N/A;  . TEE WITHOUT CARDIOVERSION N/A 07/22/2013   Procedure: TRANSESOPHAGEAL ECHOCARDIOGRAM (TEE);  Surgeon:  Fay Records, MD;  Location: [facility] ENDOSCOPY;  Service: Cardiovascular;  Laterality: N/A;  . TUBAL LIGATION       No Known Allergies    Family History  Problem Relation Age of Onset  . Heart attack Father     x's 2     Social History Wendy Moran reports that she has never smoked. She has never used smokeless tobacco. Wendy Moran reports that she does not drink alcohol.   Review of Systems CONSTITUTIONAL: No weight loss, fever, chills, weakness or fatigue.  HEENT: Eyes: No visual loss, blurred vision, double vision or yellow sclerae.No hearing loss, sneezing, congestion, runny nose or sore throat.  SKIN: No rash or itching.  CARDIOVASCULAR: per HPI RESPIRATORY: No shortness of breath, cough or sputum.  GASTROINTESTINAL: No anorexia, nausea, vomiting or diarrhea. No abdominal pain or blood.  GENITOURINARY: No burning on urination, no polyuria NEUROLOGICAL: No headache, dizziness, syncope, paralysis, ataxia, numbness or tingling in the extremities. No change in bowel or bladder control.  MUSCULOSKELETAL: No muscle, back pain, joint pain or stiffness.  LYMPHATICS: No enlarged nodes. No history of splenectomy.  PSYCHIATRIC: No history of depression or anxiety.  ENDOCRINOLOGIC: No reports of sweating, cold or heat intolerance. No polyuria or polydipsia.  Marland Kitchen   Physical Examination Vitals:   12/24/15 1249  BP: 128/75  Pulse: 76   Vitals:   12/24/15 1249  Weight: 258 lb (117 kg)  Height: 5\' 8"  (1.727 m)    Gen: resting comfortably, no acute distress HEENT: no scleral icterus, pupils equal round and reactive, no palptable cervical adenopathy,  CV: RRR, no m/r/g, no jvd Resp: Clear to auscultation bilaterally GI: abdomen is soft, non-tender, non-distended, normal bowel sounds, no hepatosplenomegaly MSK: extremities are warm, no edema.  Skin: warm, no rash Neuro:  no focal deficits Psych: appropriate affect   Diagnostic Studies 07/2013 TEE Study Conclusions  - Left  ventricle: No evidence of thrombus. - Left atrium: No evidence of thrombus in the atrial cavity or appendage. No evidence of thrombus in the atrial cavity or appendage.  Impressions:  - Successful cardioversion. No cardiac source of emboli was indentified.  Left ventricle: LVEF is normal. No evidence of thrombus.  ------------------------------------------------------------------- Aortic valve: AV is normal. No AI.  ------------------------------------------------------------------- Mitral valve: MV is normal. There is moderate MR.  ------------------------------------------------------------------- Left atrium: No evidence of thrombus in the atrial cavity or appendage. No evidence of thrombus in the atrial cavity or appendage.  ------------------------------------------------------------------- Right ventricle: RVEF is normal.  ------------------------------------------------------------------- Tricuspid valve: TV is normal. MIld TR.    12/2014 TTE Study Conclusions  - Left ventricle: The cavity size was normal. Wall thickness was  normal. Systolic function was normal. The estimated ejection  fraction was in the range of 60% to 65%. Wall motion was normal;  there were no regional wall motion abnormalities. Left  ventricular diastolic function parameters were normal. - Aortic valve: Valve area (VTI): 3.74 cm^2. Valve area (Vmax):  3.49 cm^2. Valve area (Vmean): 3.65 cm^2. - Atrial septum: No defect or patent foramen ovale was identified. - Technially adequate study.  05/2015 Nuclear stress test  There was no ST segment deviation noted during stress.  The study is normal. No myocardial ischemia or scar.  This is a low risk study.  Nuclear stress EF: 52%.  Assessment and Plan   1. Afib - palpitations a few weeks ago that have since resolved. Continue to monitor at this time.  - CHADS2Vasc score of 2, continue xarelto. - EKG in clinic today shows NSR  2.  HTN - she is at goal, she will continue current meds  3. Chest pain - recent negative stress test. Symptoms have resolved. Continue to monitor.    F/u 6 months. Request labs from pcp     Arnoldo Lenis, M.D.

## 2015-12-24 NOTE — Patient Instructions (Signed)

## 2015-12-25 ENCOUNTER — Ambulatory Visit: Payer: BLUE CROSS/BLUE SHIELD | Admitting: Cardiology

## 2016-04-01 DIAGNOSIS — I4891 Unspecified atrial fibrillation: Secondary | ICD-10-CM | POA: Diagnosis not present

## 2016-04-01 DIAGNOSIS — Z1389 Encounter for screening for other disorder: Secondary | ICD-10-CM | POA: Diagnosis not present

## 2016-04-01 DIAGNOSIS — R7309 Other abnormal glucose: Secondary | ICD-10-CM | POA: Diagnosis not present

## 2016-04-01 DIAGNOSIS — Z6837 Body mass index (BMI) 37.0-37.9, adult: Secondary | ICD-10-CM | POA: Diagnosis not present

## 2016-04-01 DIAGNOSIS — E782 Mixed hyperlipidemia: Secondary | ICD-10-CM | POA: Diagnosis not present

## 2016-04-01 DIAGNOSIS — I10 Essential (primary) hypertension: Secondary | ICD-10-CM | POA: Diagnosis not present

## 2016-06-24 DIAGNOSIS — Z1211 Encounter for screening for malignant neoplasm of colon: Secondary | ICD-10-CM | POA: Diagnosis not present

## 2016-07-07 ENCOUNTER — Encounter: Payer: Self-pay | Admitting: Cardiology

## 2016-07-07 ENCOUNTER — Ambulatory Visit (INDEPENDENT_AMBULATORY_CARE_PROVIDER_SITE_OTHER): Payer: BLUE CROSS/BLUE SHIELD | Admitting: Cardiology

## 2016-07-07 ENCOUNTER — Encounter: Payer: Self-pay | Admitting: *Deleted

## 2016-07-07 VITALS — BP 124/62 | HR 82 | Ht 68.0 in | Wt 264.0 lb

## 2016-07-07 DIAGNOSIS — I4891 Unspecified atrial fibrillation: Secondary | ICD-10-CM

## 2016-07-07 DIAGNOSIS — I340 Nonrheumatic mitral (valve) insufficiency: Secondary | ICD-10-CM | POA: Diagnosis not present

## 2016-07-07 DIAGNOSIS — I10 Essential (primary) hypertension: Secondary | ICD-10-CM

## 2016-07-07 MED ORDER — RIVAROXABAN 20 MG PO TABS: 20.0000 mg | ORAL_TABLET | Freq: Every day | ORAL | 3 refills | Status: DC

## 2016-07-07 MED ORDER — DILTIAZEM HCL ER COATED BEADS 240 MG PO CP24: 240.0000 mg | ORAL_CAPSULE | Freq: Every day | ORAL | 3 refills | Status: DC

## 2016-07-07 NOTE — Progress Notes (Signed)
Clinical Summary Ms. Suder is a 66 y.o.female  seen today for follow up of the following medical problems.   1. Afib - s/p DCCV 07/2013 - on xarelto and diltiazem.  - she reports prior negative sleep study  - no recent palpitations. Can have some occasional dizziness at times.  - mixed compliance with meds. No bleeding troubles on xarelto.  - can have some orthostatic symptoms with standing. Water x 2 bottles, diet coke x 1/2 bottle, occasional coffee, occasional tea, tomato juice V8 x 1 glass, no alcohol.   2. Mitral regurgitation - moderate by TEE 07/2013 - repeat TTE 12/2014 showed only mild MR  - no recent SOB/DOE/LE edema   3. HTN - compliant with meds - home bps 130-140s/90s   4. Severe obesity - weight up 264 lbs.  - recently bought a fitbit, working to monitor her calorie intake and exercise.    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 Works at [employer]    Past Medical History:  Diagnosis Date  . Dysrhythmia    atrial fib 2015  . High cholesterol   . Hypertension      No Known Allergies   Current Outpatient Prescriptions  Medication Sig Dispense Refill  . acetaminophen (TYLENOL) 325 MG tablet Take 2 tablets (650 mg total) by mouth every 4 (four) hours as needed for headache or mild pain.    Marland Kitchen diltiazem (CARDIZEM CD) 240 MG 24 hr capsule Take 1 capsule (240 mg total) by mouth daily. 90 capsule 3  . losartan-hydrochlorothiazide (HYZAAR) 100-25 MG tablet Take 1 tablet by mouth daily.    . meloxicam (MOBIC) 15 MG tablet Take 1 tablet by mouth daily.    . rivaroxaban (XARELTO) 20 MG TABS tablet Take 1 tablet (20 mg total) by mouth daily with supper. 90 tablet 3   No current facility-administered medications for this visit.      Past Surgical History:  Procedure Laterality Date  . CARDIOVERSION N/A 07/22/2013   Procedure: CARDIOVERSION;  Surgeon: Fay Records, MD;  Location: [place];  Service:  Cardiovascular;  Laterality: N/A;  . TEE WITHOUT CARDIOVERSION N/A 07/22/2013   Procedure: TRANSESOPHAGEAL ECHOCARDIOGRAM (TEE);  Surgeon: Fay Records, MD;  Location: [facility] ENDOSCOPY;  Service: Cardiovascular;  Laterality: N/A;  . TUBAL LIGATION       No Known Allergies    Family History  Problem Relation Age of Onset  . Heart attack Father        x's 2     Social History Ms. Saline reports that she has never smoked. She has never used smokeless tobacco. Ms. Manansala reports that she does not drink alcohol.   Review of Systems CONSTITUTIONAL: No weight loss, fever, chills, weakness or fatigue.  HEENT: Eyes: No visual loss, blurred vision, double vision or yellow sclerae.No hearing loss, sneezing, congestion, runny nose or sore throat.  SKIN: No rash or itching.  CARDIOVASCULAR: per hpi RESPIRATORY: No shortness of breath, cough or sputum.  GASTROINTESTINAL: No anorexia, nausea, vomiting or diarrhea. No abdominal pain or blood.  GENITOURINARY: No burning on urination, no polyuria NEUROLOGICAL: No headache, dizziness, syncope, paralysis, ataxia, numbness or tingling in the extremities. No change in bowel or bladder control.  MUSCULOSKELETAL: No muscle, back pain, joint pain or stiffness.  LYMPHATICS: No enlarged nodes. No history of splenectomy.  PSYCHIATRIC: No history of depression or anxiety.  ENDOCRINOLOGIC: No reports of sweating, cold or heat intolerance. No polyuria or  polydipsia.  Marland Kitchen   Physical Examination Vitals:   07/07/16 0819  BP: 124/62  Pulse: 82   Vitals:   07/07/16 0819  Weight: 264 lb (119.7 kg)  Height: 5\' 8"  (1.727 m)    Gen: resting comfortably, no acute distress HEENT: no scleral icterus, pupils equal round and reactive, no palptable cervical adenopathy,  CV: RRR, 2/6 systolic murmur rusb, no jvd Resp: Clear to auscultation bilaterally GI: abdomen is soft, non-tender, non-distended, normal bowel sounds, no hepatosplenomegaly MSK: extremities  are warm, no edema.  Skin: warm, no rash Neuro:  no focal deficits Psych: appropriate affect   Diagnostic Studies 07/2013 TEE Study Conclusions  - Left ventricle: No evidence of thrombus. - Left atrium: No evidence of thrombus in the atrial cavity or appendage. No evidence of thrombus in the atrial cavity or appendage.  Impressions:  - Successful cardioversion. No cardiac source of emboli was indentified. Left ventricle: LVEF is normal. No evidence of thrombus.  ------------------------------------------------------------------- Aortic valve: AV is normal. No AI.  ------------------------------------------------------------------- Mitral valve: MV is normal. There is moderate MR.  ------------------------------------------------------------------- Left atrium: No evidence of thrombus in the atrial cavity or appendage. No evidence of thrombus in the atrial cavity or appendage.  ------------------------------------------------------------------- Right ventricle: RVEF is normal.  ------------------------------------------------------------------- Tricuspid valve: TV is normal. MIld TR.   12/2014 TTE Study Conclusions  - Left ventricle: The cavity size was normal. Wall thickness was  normal. Systolic function was normal. The estimated ejection  fraction was in the range of 60% to 65%. Wall motion was normal;  there were no regional wall motion abnormalities. Left  ventricular diastolic function parameters were normal. - Aortic valve: Valve area (VTI): 3.74 cm^2. Valve area (Vmax):  3.49 cm^2. Valve area (Vmean): 3.65 cm^2. - Atrial septum: No defect or patent foramen ovale was identified. - Technially adequate study.  05/2015 Nuclear stress test  There was no ST segment deviation noted during stress.  The study is normal. No myocardial ischemia or scar.  This is a low risk study.  Nuclear stress EF: 52%.    Assessment and Plan   1. Afib - no  recent symptoms.  - CHADS2Vasc score of 2, continue xarelto.   2. HTN - she is at goal. Some orthostatic symptoms, encouraged increased hydration. If persists consider lowering HCTZ dose  3. Mitral regurgitation - no symptoms, continue to monitor  4. Morbid obesity - BMI 40, we discussed the associated health concerns. She is not interested in nutrition referral at this time.    F/u 6 months. Request labs from pcp     Arnoldo Lenis, M.D.

## 2016-07-07 NOTE — Patient Instructions (Signed)
Your physician wants you to follow-up in: 6 Months with Dr. Branch. You will receive a reminder letter in the mail two months in advance. If you don't receive a letter, please call our office to schedule the follow-up appointment.  Your physician recommends that you continue on your current medications as directed. Please refer to the Current Medication list given to you today.  If you need a refill on your cardiac medications before your next appointment, please call your pharmacy.  Thank you for choosing [facility]!   

## 2016-07-24 ENCOUNTER — Telehealth: Payer: Self-pay | Admitting: *Deleted

## 2016-07-24 NOTE — Telephone Encounter (Signed)
Patient notified

## 2016-07-24 NOTE — Telephone Encounter (Signed)
Hold for 48 hrs before, can restart day after   Zandra Abts MD

## 2016-07-24 NOTE — Telephone Encounter (Signed)
Pt having tooth extraction on 7/30 by Dr Jannifer Franklin Linna Hoff) does she need to hold Xarelto 24 or 48 hour prior ?

## 2016-12-06 DIAGNOSIS — Z23 Encounter for immunization: Secondary | ICD-10-CM | POA: Diagnosis not present

## 2017-03-02 DIAGNOSIS — Z1389 Encounter for screening for other disorder: Secondary | ICD-10-CM | POA: Diagnosis not present

## 2017-03-02 DIAGNOSIS — Z6837 Body mass index (BMI) 37.0-37.9, adult: Secondary | ICD-10-CM | POA: Diagnosis not present

## 2017-03-02 DIAGNOSIS — I4891 Unspecified atrial fibrillation: Secondary | ICD-10-CM | POA: Diagnosis not present

## 2017-03-02 DIAGNOSIS — R7309 Other abnormal glucose: Secondary | ICD-10-CM | POA: Diagnosis not present

## 2017-03-02 DIAGNOSIS — E782 Mixed hyperlipidemia: Secondary | ICD-10-CM | POA: Diagnosis not present

## 2017-03-02 DIAGNOSIS — I10 Essential (primary) hypertension: Secondary | ICD-10-CM | POA: Diagnosis not present

## 2017-04-13 DIAGNOSIS — Z6837 Body mass index (BMI) 37.0-37.9, adult: Secondary | ICD-10-CM | POA: Diagnosis not present

## 2017-04-13 DIAGNOSIS — Z1389 Encounter for screening for other disorder: Secondary | ICD-10-CM | POA: Diagnosis not present

## 2017-04-13 DIAGNOSIS — E039 Hypothyroidism, unspecified: Secondary | ICD-10-CM | POA: Diagnosis not present

## 2017-04-13 DIAGNOSIS — J069 Acute upper respiratory infection, unspecified: Secondary | ICD-10-CM | POA: Diagnosis not present

## 2017-05-18 ENCOUNTER — Ambulatory Visit (HOSPITAL_COMMUNITY)
Admission: RE | Admit: 2017-05-18 | Discharge: 2017-05-18 | Disposition: A | Discharge status: Home / Self Care | Payer: BLUE CROSS/BLUE SHIELD | Source: Ambulatory Visit | Attending: Family Medicine | Admitting: Family Medicine

## 2017-05-18 ENCOUNTER — Other Ambulatory Visit (HOSPITAL_COMMUNITY): Payer: Self-pay | Admitting: Family Medicine

## 2017-05-18 DIAGNOSIS — I4891 Unspecified atrial fibrillation: Secondary | ICD-10-CM | POA: Diagnosis not present

## 2017-05-18 DIAGNOSIS — R55 Syncope and collapse: Secondary | ICD-10-CM

## 2017-05-18 DIAGNOSIS — R51 Headache: Secondary | ICD-10-CM | POA: Diagnosis not present

## 2017-05-18 DIAGNOSIS — R9431 Abnormal electrocardiogram [ECG] [EKG]: Secondary | ICD-10-CM | POA: Insufficient documentation

## 2017-05-18 DIAGNOSIS — Z1389 Encounter for screening for other disorder: Secondary | ICD-10-CM | POA: Diagnosis not present

## 2017-05-18 DIAGNOSIS — E6609 Other obesity due to excess calories: Secondary | ICD-10-CM | POA: Diagnosis not present

## 2017-05-29 ENCOUNTER — Ambulatory Visit (HOSPITAL_COMMUNITY): Admission: RE | Admit: 2017-05-29 | Payer: BLUE CROSS/BLUE SHIELD | Source: Ambulatory Visit

## 2017-06-26 ENCOUNTER — Encounter: Payer: Self-pay | Admitting: Cardiology

## 2017-06-26 ENCOUNTER — Ambulatory Visit (INDEPENDENT_AMBULATORY_CARE_PROVIDER_SITE_OTHER): Payer: BLUE CROSS/BLUE SHIELD | Admitting: Cardiology

## 2017-06-26 ENCOUNTER — Encounter: Payer: Self-pay | Admitting: *Deleted

## 2017-06-26 VITALS — BP 114/74 | HR 82 | Ht 68.0 in | Wt 255.4 lb

## 2017-06-26 DIAGNOSIS — I10 Essential (primary) hypertension: Secondary | ICD-10-CM

## 2017-06-26 DIAGNOSIS — I4891 Unspecified atrial fibrillation: Secondary | ICD-10-CM | POA: Diagnosis not present

## 2017-06-26 DIAGNOSIS — I340 Nonrheumatic mitral (valve) insufficiency: Secondary | ICD-10-CM

## 2017-06-26 MED ORDER — RIVAROXABAN 20 MG PO TABS: 20.0000 mg | ORAL_TABLET | Freq: Every day | ORAL | 3 refills | Status: DC

## 2017-06-26 MED ORDER — DILTIAZEM HCL ER COATED BEADS 240 MG PO CP24: 240.0000 mg | ORAL_CAPSULE | Freq: Every day | ORAL | 3 refills | Status: DC

## 2017-06-26 NOTE — Progress Notes (Signed)
Clinical Summary Wendy Moran is a 67 y.o.female seen today for follow up of the following medical problems.   1. Afib - s/p DCCV 07/2013 - on xarelto and diltiazem.  - she reports prior negative sleep study  -- no recent palpitations - compliant with meds - no bleeding on xarelto.    2. Mitral regurgitation - moderate by TEE 07/2013 - repeat TTE 12/2014 showed only mild MR   - no recent edema, no SOB/DOE.    3. HTN - she is compliant with meds   4. Fall mecahinlcal fall 02/2017 - episode 05/2017 she does not recall details.  - seen by pcp, CT head was negative. No recent syncope, no significant lightheadedness or dizziness.        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 Works at [employer]   Harvie Heck had to put to sleep, recently got a new dog named Peanut.   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        Past Medical History:  Diagnosis Date  . Dysrhythmia    atrial fib 2015  . High cholesterol   . Hypertension      No Known Allergies   Current Outpatient Medications  Medication Sig Dispense Refill  . acetaminophen (TYLENOL) 325 MG tablet Take 2 tablets (650 mg total) by mouth every 4 (four) hours as needed for headache or mild pain.    Marland Kitchen diltiazem (CARDIZEM CD) 240 MG 24 hr capsule Take 1 capsule (240 mg total) by mouth daily. 90 capsule 3  . losartan-hydrochlorothiazide (HYZAAR) 100-25 MG tablet Take 1 tablet by mouth daily.    . meloxicam (MOBIC) 15 MG tablet Take 1 tablet by mouth daily.    . rivaroxaban (XARELTO) 20 MG TABS tablet Take 1 tablet (20 mg total) by mouth daily with supper. 90 tablet 3  . zolpidem (AMBIEN) 10 MG tablet Take 10 mg by mouth at bedtime as needed for sleep.     No current facility-administered medications for this visit.      Past Surgical History:  Procedure Laterality Date  . CARDIOVERSION N/A 07/22/2013   Procedure:  CARDIOVERSION;  Surgeon: Fay Records, MD;  Location: [place];  Service: Cardiovascular;  Laterality: N/A;  . TEE WITHOUT CARDIOVERSION N/A 07/22/2013   Procedure: TRANSESOPHAGEAL ECHOCARDIOGRAM (TEE);  Surgeon: Fay Records, MD;  Location: [facility] ENDOSCOPY;  Service: Cardiovascular;  Laterality: N/A;  . TUBAL LIGATION       No Known Allergies    Family History  Problem Relation Age of Onset  . Heart attack Father        x's 2     Social History Ms. Vida reports that she has never smoked. She has never used smokeless tobacco. Ms. Gaubert reports that she does not drink alcohol.   Review of Systems CONSTITUTIONAL: No weight loss, fever, chills, weakness or fatigue.  HEENT: Eyes: No visual loss, blurred vision, double vision or yellow sclerae.No hearing loss, sneezing, congestion, runny nose or sore throat.  SKIN: No rash or itching.  CARDIOVASCULAR: per hpi RESPIRATORY: No shortness of breath, cough or sputum.  GASTROINTESTINAL: No anorexia, nausea, vomiting or diarrhea. No abdominal pain or blood.  GENITOURINARY: No burning on urination, no polyuria NEUROLOGICAL: No headache, dizziness, syncope, paralysis, ataxia, numbness or tingling in the extremities. No change in bowel or bladder control.  MUSCULOSKELETAL: No muscle, back pain, joint pain or stiffness.  LYMPHATICS: No  enlarged nodes. No history of splenectomy.  PSYCHIATRIC: No history of depression or anxiety.  ENDOCRINOLOGIC: No reports of sweating, cold or heat intolerance. No polyuria or polydipsia.  Marland Kitchen   Physical Examination Vitals:   06/26/17 1058  BP: 114/74  Pulse: 82  SpO2: 98%   Vitals:   06/26/17 1058  Weight: 255 lb 6.4 oz (115.8 kg)  Height: 5\' 8"  (1.727 m)    Gen: resting comfortably, no acute distress HEENT: no scleral icterus, pupils equal round and reactive, no palptable cervical adenopathy,  CV: RRR, no m/r/g, no jvd Resp: Clear to auscultation bilaterally GI: abdomen is soft,  non-tender, non-distended, normal bowel sounds, no hepatosplenomegaly MSK: extremities are warm, no edema.  Skin: warm, no rash Neuro:  no focal deficits Psych: appropriate affect   Diagnostic Studies 07/2013 TEE Study Conclusions  - Left ventricle: No evidence of thrombus. - Left atrium: No evidence of thrombus in the atrial cavity or appendage. No evidence of thrombus in the atrial cavity or appendage.  Impressions:  - Successful cardioversion. No cardiac source of emboli was indentified. Left ventricle: LVEF is normal. No evidence of thrombus.  ------------------------------------------------------------------- Aortic valve: AV is normal. No AI.  ------------------------------------------------------------------- Mitral valve: MV is normal. There is moderate MR.  ------------------------------------------------------------------- Left atrium: No evidence of thrombus in the atrial cavity or appendage. No evidence of thrombus in the atrial cavity or appendage.  ------------------------------------------------------------------- Right ventricle: RVEF is normal.  ------------------------------------------------------------------- Tricuspid valve: TV is normal. MIld TR.   12/2014 TTE Study Conclusions  - Left ventricle: The cavity size was normal. Wall thickness was  normal. Systolic function was normal. The estimated ejection  fraction was in the range of 60% to 65%. Wall motion was normal;  there were no regional wall motion abnormalities. Left  ventricular diastolic function parameters were normal. - Aortic valve: Valve area (VTI): 3.74 cm^2. Valve area (Vmax):  3.49 cm^2. Valve area (Vmean): 3.65 cm^2. - Atrial septum: No defect or patent foramen ovale was identified. - Technially adequate study.  05/2015 Nuclear stress test  There was no ST segment deviation noted during stress.  The study is normal. No myocardial ischemia or scar.  This is a low  risk study.  Nuclear stress EF: 52%   Assessment and Plan    1. Afib - CHADS2Vasc score of 2, continue xarelto. - no symptoms, continue current meds   2. HTN - at goal, continue current meds  3. Mitral regurgitation - no recent symptoms, continue to monitor.     F/u 1 year    Arnoldo Lenis, M.D.

## 2017-06-26 NOTE — Patient Instructions (Addendum)
Medication Instructions:  Continue all current medications.  Labwork: none  Testing/Procedures: none  Follow-Up: Your physician wants you to follow up in:  1 year.  You will receive a reminder letter in the mail one-two months in advance.  If you don't receive a letter, please call our office to schedule the follow up appointment  - [place] office.   Any Other Special Instructions Will Be Listed Below (If Applicable).  If you need a refill on your cardiac medications before your next appointment, please call your pharmacy.

## 2017-07-07 ENCOUNTER — Encounter: Payer: Self-pay | Admitting: Cardiology

## 2017-09-09 ENCOUNTER — Other Ambulatory Visit: Payer: Self-pay | Admitting: Internal Medicine

## 2017-09-09 ENCOUNTER — Ambulatory Visit (HOSPITAL_COMMUNITY)
Admission: RE | Admit: 2017-09-09 | Discharge: 2017-09-09 | Disposition: A | Discharge status: Home / Self Care | Payer: BLUE CROSS/BLUE SHIELD | Source: Ambulatory Visit | Attending: Family Medicine | Admitting: Family Medicine

## 2017-09-09 ENCOUNTER — Other Ambulatory Visit (HOSPITAL_COMMUNITY): Payer: Self-pay | Admitting: Family Medicine

## 2017-09-09 DIAGNOSIS — Z1389 Encounter for screening for other disorder: Secondary | ICD-10-CM | POA: Insufficient documentation

## 2017-09-09 DIAGNOSIS — S9032XD Contusion of left foot, subsequent encounter: Secondary | ICD-10-CM

## 2017-09-09 DIAGNOSIS — E782 Mixed hyperlipidemia: Secondary | ICD-10-CM | POA: Insufficient documentation

## 2017-09-09 DIAGNOSIS — S9032XA Contusion of left foot, initial encounter: Secondary | ICD-10-CM | POA: Diagnosis not present

## 2017-09-09 DIAGNOSIS — I10 Essential (primary) hypertension: Secondary | ICD-10-CM | POA: Diagnosis not present

## 2017-09-09 DIAGNOSIS — E039 Hypothyroidism, unspecified: Secondary | ICD-10-CM | POA: Diagnosis not present

## 2017-09-09 DIAGNOSIS — I4891 Unspecified atrial fibrillation: Secondary | ICD-10-CM | POA: Diagnosis not present

## 2017-09-09 DIAGNOSIS — Z23 Encounter for immunization: Secondary | ICD-10-CM | POA: Diagnosis not present

## 2017-09-09 DIAGNOSIS — Z6837 Body mass index (BMI) 37.0-37.9, adult: Secondary | ICD-10-CM | POA: Diagnosis not present

## 2018-03-31 DIAGNOSIS — E039 Hypothyroidism, unspecified: Secondary | ICD-10-CM | POA: Diagnosis not present

## 2018-03-31 DIAGNOSIS — I10 Essential (primary) hypertension: Secondary | ICD-10-CM | POA: Diagnosis not present

## 2018-05-13 DIAGNOSIS — I10 Essential (primary) hypertension: Secondary | ICD-10-CM | POA: Diagnosis not present

## 2018-06-03 DIAGNOSIS — K589 Irritable bowel syndrome without diarrhea: Secondary | ICD-10-CM | POA: Diagnosis not present

## 2018-06-03 DIAGNOSIS — R140 Abdominal distension (gaseous): Secondary | ICD-10-CM | POA: Diagnosis not present

## 2018-06-03 DIAGNOSIS — Z1389 Encounter for screening for other disorder: Secondary | ICD-10-CM | POA: Diagnosis not present

## 2018-06-03 DIAGNOSIS — Z6837 Body mass index (BMI) 37.0-37.9, adult: Secondary | ICD-10-CM | POA: Diagnosis not present

## 2018-06-10 ENCOUNTER — Encounter: Payer: Self-pay | Admitting: Nurse Practitioner

## 2018-07-01 ENCOUNTER — Encounter: Payer: Self-pay | Admitting: Cardiology

## 2018-07-01 ENCOUNTER — Other Ambulatory Visit: Payer: Self-pay

## 2018-07-01 ENCOUNTER — Ambulatory Visit (INDEPENDENT_AMBULATORY_CARE_PROVIDER_SITE_OTHER): Payer: BC Managed Care – PPO | Admitting: Cardiology

## 2018-07-01 VITALS — BP 135/73 | Temp 98.3°F | Ht 67.0 in | Wt 263.0 lb

## 2018-07-01 DIAGNOSIS — I10 Essential (primary) hypertension: Secondary | ICD-10-CM

## 2018-07-01 DIAGNOSIS — I4891 Unspecified atrial fibrillation: Secondary | ICD-10-CM | POA: Diagnosis not present

## 2018-07-01 DIAGNOSIS — R002 Palpitations: Secondary | ICD-10-CM | POA: Diagnosis not present

## 2018-07-01 MED ORDER — RIVAROXABAN 20 MG PO TABS: 20.0000 mg | ORAL_TABLET | Freq: Every day | ORAL | 3 refills | Status: DC

## 2018-07-01 MED ORDER — DILTIAZEM HCL ER COATED BEADS 240 MG PO CP24: 240.0000 mg | ORAL_CAPSULE | Freq: Every day | ORAL | 3 refills | Status: DC

## 2018-07-01 NOTE — Progress Notes (Signed)
Clinical Summary Wendy Moran is a 68 y.o.female seen today for follow up of the following medical problems.   1. Afib - s/p DCCV 07/2013 - on xarelto and diltiazem.  - she reports prior negative sleep study   - occasional lightheadedness, with palpitations. About 2-3 times a week. No clear trigger - compliant with meds  2. Mitral regurgitation - moderate by TEE 07/2013 - repeat TTE 12/2014 showed only mild MR  - no recent symptoms   3. HTN - she is compliant with meds    SH:   Harvie Heck had to put to sleep few years ago, remains emotional about it. 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  Past Medical History:  Diagnosis Date  . Dysrhythmia    atrial fib 2015  . High cholesterol   . Hypertension      No Known Allergies   Current Outpatient Medications  Medication Sig Dispense Refill  . acetaminophen (TYLENOL) 325 MG tablet Take 2 tablets (650 mg total) by mouth every 4 (four) hours as needed for headache or mild pain.    Marland Kitchen diltiazem (CARDIZEM CD) 240 MG 24 hr capsule Take 1 capsule (240 mg total) by mouth daily. 90 capsule 3  . levothyroxine (SYNTHROID, LEVOTHROID) 50 MCG tablet Take 1 tablet by mouth daily.  10  . losartan-hydrochlorothiazide (HYZAAR) 100-25 MG tablet Take 1 tablet by mouth daily.    . rivaroxaban (XARELTO) 20 MG TABS tablet Take 1 tablet (20 mg total) by mouth daily with supper. 90 tablet 3  . zolpidem (AMBIEN) 10 MG tablet Take 10 mg by mouth at bedtime as needed for sleep.     No current facility-administered medications for this visit.      Past Surgical History:  Procedure Laterality Date  . CARDIOVERSION N/A 07/22/2013   Procedure: CARDIOVERSION;  Surgeon: Fay Records, MD;  Location: [place];  Service: Cardiovascular;  Laterality: N/A;  . TEE WITHOUT CARDIOVERSION N/A 07/22/2013   Procedure: TRANSESOPHAGEAL ECHOCARDIOGRAM (TEE);  Surgeon:  Fay Records, MD;  Location: [facility] ENDOSCOPY;  Service: Cardiovascular;  Laterality: N/A;  . TUBAL LIGATION       No Known Allergies    Family History  Problem Relation Age of Onset  . Heart attack Father        x's 2     Social History Ms. Trentham reports that she has never smoked. She has never used smokeless tobacco. Ms. Gramajo reports no history of alcohol use.   Review of Systems CONSTITUTIONAL: No weight loss, fever, chills, weakness or fatigue.  HEENT: Eyes: No visual loss, blurred vision, double vision or yellow sclerae.No hearing loss, sneezing, congestion, runny nose or sore throat.  SKIN: No rash or itching.  CARDIOVASCULAR: per hpi RESPIRATORY: No shortness of breath, cough or sputum.  GASTROINTESTINAL: No anorexia, nausea, vomiting or diarrhea. No abdominal pain or blood.  GENITOURINARY: No burning on urination, no polyuria NEUROLOGICAL: per hpi  MUSCULOSKELETAL: No muscle, back pain, joint pain or stiffness.  LYMPHATICS: No enlarged nodes. No history of splenectomy.  PSYCHIATRIC: No history of depression or anxiety.  ENDOCRINOLOGIC: No reports of sweating, cold or heat intolerance. No polyuria or polydipsia.  Marland Kitchen   Physical Examination Today's Vitals   07/01/18 1300  BP: 135/73  Temp: 98.3 F (36.8 C)  TempSrc: Temporal  SpO2: 96%  Weight: 263 lb (119.3 kg)  Height: 5\' 7"  (1.702 m)   Body  mass index is 41.19 kg/m.  Gen: resting comfortably, no acute distress HEENT: no scleral icterus, pupils equal round and reactive, no palptable cervical adenopathy,  CV: RRR, no m/r/g, no jvd Resp: Clear to auscultation bilaterally GI: abdomen is soft, non-tender, non-distended, normal bowel sounds, no hepatosplenomegaly MSK: extremities are warm, no edema.  Skin: warm, no rash Neuro:  no focal deficits Psych: appropriate affect   Diagnostic Studies  07/2013 TEE Study Conclusions  - Left ventricle: No evidence of thrombus. - Left atrium: No evidence of  thrombus in the atrial cavity or appendage. No evidence of thrombus in the atrial cavity or appendage.  Impressions:  - Successful cardioversion. No cardiac source of emboli was indentified. Left ventricle: LVEF is normal. No evidence of thrombus.  ------------------------------------------------------------------- Aortic valve: AV is normal. No AI.  ------------------------------------------------------------------- Mitral valve: MV is normal. There is moderate MR.  ------------------------------------------------------------------- Left atrium: No evidence of thrombus in the atrial cavity or appendage. No evidence of thrombus in the atrial cavity or appendage.  ------------------------------------------------------------------- Right ventricle: RVEF is normal.  ------------------------------------------------------------------- Tricuspid valve: TV is normal. MIld TR.   12/2014 TTE Study Conclusions  - Left ventricle: The cavity size was normal. Wall thickness was  normal. Systolic function was normal. The estimated ejection  fraction was in the range of 60% to 65%. Wall motion was normal;  there were no regional wall motion abnormalities. Left  ventricular diastolic function parameters were normal. - Aortic valve: Valve area (VTI): 3.74 cm^2. Valve area (Vmax):  3.49 cm^2. Valve area (Vmean): 3.65 cm^2. - Atrial septum: No defect or patent foramen ovale was identified. - Technially adequate study.  05/2015 Nuclear stress test  There was no ST segment deviation noted during stress.  The study is normal. No myocardial ischemia or scar.  This is a low risk study.  Nuclear stress EF: 52%   Assessment and Plan  1. Afib - recent dizziness and palpitations, somewhat vague episodes though fairly frequent. Unclear if related to afib - obtain 2 week event monitor - continue current meds   EKG today shows NSR    2. HTN - reasonable control, continue  current meds     F/u 6 weeks      Arnoldo Lenis, M.D.

## 2018-07-01 NOTE — Patient Instructions (Signed)
Medication Instructions:  Your physician recommends that you continue on your current medications as directed. Please refer to the Current Medication list given to you today.   Labwork: NONE  Testing/Procedures: Your physician has recommended that you wear an event monitor. Event monitors are medical devices that record the heart's electrical activity. Doctors most often [place] these monitors to diagnose arrhythmias. Arrhythmias are problems with the speed or rhythm of the heartbeat. The monitor is a small, portable device. You can wear one while you do your normal daily activities. This is usually used to diagnose what is causing palpitations/syncope (passing out).  (14 DAYS)     Follow-Up: Your physician recommends that you schedule a follow-up appointment in: 6 WEEKS    Any Other Special Instructions Will Be Listed Below (If Applicable).     If you need a refill on your cardiac medications before your next appointment, please call your pharmacy.

## 2018-08-09 ENCOUNTER — Encounter: Payer: Self-pay | Admitting: Nurse Practitioner

## 2018-08-09 ENCOUNTER — Other Ambulatory Visit: Payer: Self-pay

## 2018-08-09 ENCOUNTER — Ambulatory Visit (INDEPENDENT_AMBULATORY_CARE_PROVIDER_SITE_OTHER): Payer: BC Managed Care – PPO | Admitting: Nurse Practitioner

## 2018-08-09 ENCOUNTER — Encounter (HOSPITAL_COMMUNITY): Payer: BC Managed Care – PPO

## 2018-08-09 ENCOUNTER — Telehealth: Payer: Self-pay

## 2018-08-09 ENCOUNTER — Ambulatory Visit (INDEPENDENT_AMBULATORY_CARE_PROVIDER_SITE_OTHER): Payer: BC Managed Care – PPO

## 2018-08-09 DIAGNOSIS — K5900 Constipation, unspecified: Secondary | ICD-10-CM

## 2018-08-09 DIAGNOSIS — R002 Palpitations: Secondary | ICD-10-CM

## 2018-08-09 MED ORDER — LINACLOTIDE 72 MCG PO CAPS: 72.0000 ug | ORAL_CAPSULE | Freq: Every day | ORAL | 2 refills | Status: DC

## 2018-08-09 NOTE — Assessment & Plan Note (Signed)
Patient describes constipation including hard stools, straining, incomplete emptying.  After initial hard stool she will have looser stools likely overflow diarrhea.  Query possibility of irritable bowel syndrome.  She does have crampy lower abdominal discomfort which improves after a bowel movement.  This started about 3 to 4 months ago.  She is also never had a colonoscopy.  However, she declines a colonoscopy today due to difficulties obtaining a ride.  At this point I will start her on Linzess 72 mcg.  Request Proctocort 1 to 2 weeks.  Further medication titrations or agent change based on her clinical response.  Follow-up in 2 months.

## 2018-08-09 NOTE — Progress Notes (Signed)
Primary Care Physician:  Sharilyn Sites, MD Primary Gastroenterologist:  Dr. Gala Romney  Chief Complaint  Patient presents with  . Consult    TCS. never had prior. parents had polyps  . Gas  . Abdominal Pain    comes/goes  . Constipation    BM daily but strains, occas blood in stool    HPI:   Wendy Moran is a 68 y.o. female who presents on referral for colonoscopy.  Nurse/phone triage was deferred to office visit due to GI symptoms as well as on Xarelto anticoagulant.  Patient provided with referral including office note dated 06/03/2018.  At that time noted abdominal pain, gas, constipation for 1 month.  Pain is described as crampy and bloating, diffuse, intermittent constipation and diarrhea.  She was diagnosed with irritable bowel syndrome and recommended align, fiber, Gas-X and referral to GI.  No history of colonoscopy found in our system.  Today she states she's wanting to get her stomach straightened out. Not wanting to have colonoscopy. About 3-4 months ago began having a lot of gas. She then started having constipation with a daily bowel movement, a lot fo straining and hard stool; After hard stool she will typically have diarrhea. Has some intermittent lower abdominal discomfort which improves after a bowel movement. Does feel like she has incomplete emptying of her bowels. Denies N/V, hematochezia, melena, fever, chills, unintentional weight loss. Denies URI or flu-like symptoms. Denies loss of sense of taste or smell. Denies chest pain, dyspnea, dizziness, lightheadedness, syncope, near syncope. Denies any other upper or lower GI symptoms.  She is declining a TCS today due to difficulties with transportation.  Past Medical History:  Diagnosis Date  . Atrial fibrillation ([place])   . High cholesterol   . Hypertension     Past Surgical History:  Procedure Laterality Date  . CARDIOVERSION N/A 07/22/2013   Procedure: CARDIOVERSION;  Surgeon: Fay Records, MD;  Location: [place];  Service: Cardiovascular;  Laterality: N/A;  . TEE WITHOUT CARDIOVERSION N/A 07/22/2013   Procedure: TRANSESOPHAGEAL ECHOCARDIOGRAM (TEE);  Surgeon: Fay Records, MD;  Location: [facility] ENDOSCOPY;  Service: Cardiovascular;  Laterality: N/A;  . TUBAL LIGATION      Current Outpatient Medications  Medication Sig Dispense Refill  . acetaminophen (TYLENOL) 325 MG tablet Take 2 tablets (650 mg total) by mouth every 4 (four) hours as needed for headache or mild pain.    Marland Kitchen diltiazem (CARDIZEM CD) 240 MG 24 hr capsule Take 1 capsule (240 mg total) by mouth daily. 90 capsule 3  . levothyroxine (SYNTHROID, LEVOTHROID) 50 MCG tablet Take 1 tablet by mouth daily.  10  . olmesartan-hydrochlorothiazide (BENICAR HCT) 40-25 MG tablet Take 1 tablet by mouth daily.     . rivaroxaban (XARELTO) 20 MG TABS tablet Take 1 tablet (20 mg total) by mouth daily with supper. 90 tablet 3  . zolpidem (AMBIEN) 10 MG tablet Take 10 mg by mouth at bedtime as needed for sleep.     No current facility-administered medications for this visit.     Allergies as of 08/09/2018  . (No Known Allergies)    Family History  Problem Relation Age of Onset  . Heart attack Father        x's 2  . Colon cancer Neg Hx     Social History   Socioeconomic History  . Marital status: Divorced    Spouse name: Not on file  . Number of children: Not on file  . Years of  education: Not on file  . Highest education level: Not on file  Occupational History  . Not on file  Social Needs  . Financial resource strain: Not on file  . Food insecurity    Worry: Not on file    Inability: Not on file  . Transportation needs    Medical: Not on file    Non-medical: Not on file  Tobacco Use  . Smoking status: Never Smoker  . Smokeless tobacco: Never Used  Substance and Sexual Activity  . Alcohol use: Yes    Alcohol/week: 0.0 standard drinks    Comment: occas  . Drug use: No  . Sexual activity: Not on file  Lifestyle  . Physical  activity    Days per week: Not on file    Minutes per session: Not on file  . Stress: Not on file  Relationships  . Social Herbalist on phone: Not on file    Gets together: Not on file    Attends religious service: Not on file    Active member of club or organization: Not on file    Attends meetings of clubs or organizations: Not on file    Relationship status: Not on file  . Intimate partner violence    Fear of current or ex partner: Not on file    Emotionally abused: Not on file    Physically abused: Not on file    Forced sexual activity: Not on file  Other Topics Concern  . Not on file  Social History Narrative  . Not on file    Review of Systems: General: Negative for anorexia, weight loss, fever, chills, fatigue, weakness. ENT: Negative for hoarseness, difficulty swallowing. CV: Negative for chest pain, angina, palpitations, peripheral edema.  Respiratory: Negative for dyspnea at rest, cough, sputum, wheezing.  GI: See history of present illness. MS: Negative for joint pain, low back pain.  Derm: Negative for rash or itching.  Endo: Negative for unusual weight change.  Heme: Negative for bruising or bleeding. Allergy: Negative for rash or hives.    Physical Exam: BP 140/74   Pulse 87   Temp (!) 97.1 F (36.2 C) (Oral)   Ht 5\' 7"  (1.702 m)   Wt 261 lb 6.4 oz (118.6 kg)   BMI 40.94 kg/m  General:   Alert and oriented. Pleasant and cooperative. Well-nourished and well-developed.  Head:  Normocephalic and atraumatic. Eyes:  Without icterus, sclera clear and conjunctiva pink.  Ears:  Normal auditory acuity. Cardiovascular:  S1, S2 present without murmurs appreciated. Extremities without clubbing or edema. Respiratory:  Clear to auscultation bilaterally. No wheezes, rales, or rhonchi. No distress.  Gastrointestinal:  +BS, soft, non-tender and non-distended. No HSM noted. No guarding or rebound. No masses appreciated.  Rectal:  Deferred  Musculoskalatal:   Symmetrical without gross deformities. Neurologic:  Alert and oriented x4;  grossly normal neurologically. Psych:  Alert and cooperative. Normal mood and affect. Heme/Lymph/Immune: No excessive bruising noted.    08/09/2018 8:50 AM   Disclaimer: This note was dictated with voice recognition software. Similar sounding words can inadvertently be transcribed and may not be corrected upon review.

## 2018-08-09 NOTE — Patient Instructions (Signed)
Your health issues we discussed today were:   Constipation: 1. I sent a medication called Linzess 72 mcg to your pharmacy. 2. Take this once a day, on an empty stomach (generally first thing upon waking). 3. Call us in 1 to 2 weeks and let us know if the medication is working for you 4. Call us if you have any worsening or severe symptoms.  Colonoscopy: 1. I understand your predicament around transportation 2. If you are able to obtain transportation to a colonoscopy let us know and we can arrange at that time  Overall I recommend:  1. Continue your current medications 2. Return for follow-up in 2 months 3. Call us if you have any questions or concerns.   Because of recent events of COVID-19 ("Coronavirus"), follow CDC recommendations:  1. Wash your hand frequently 2. Avoid touching your face 3. Stay away from people who are sick 4. If you have symptoms such as fever, cough, shortness of breath then call your healthcare provider for further guidance 5. If you are sick, STAY AT HOME unless otherwise directed by your healthcare provider. 6. Follow directions from state and national officials regarding staying safe   At [facility] we value your feedback. You may receive a survey about your visit today. Please share your experience as we strive to create trusting relationships with our patients to provide genuine, compassionate, quality care.  We appreciate your understanding and patience as we review any laboratory studies, imaging, and other diagnostic tests that are ordered as we care for you. Our office policy is 5 business days for review of these results, and any emergent or urgent results are addressed in a timely manner for your best interest. If you do not hear from our office in 1 week, please contact us.   We also encourage the use of MyChart, which contains your medical information for your review as well. If you are not enrolled in this feature, an access  code is on this after visit summary for your convenience. Thank you for allowing [place] to be involved in your care.  It was great to see you today!  I hope you have a great summer!!

## 2018-08-09 NOTE — Telephone Encounter (Signed)
Pt was informed we have received samples of Linzess 72 mcg.  She will drop by the office and pick up #8 to take one capsule daily 30 min before breakfast.

## 2018-08-10 ENCOUNTER — Telehealth: Payer: Self-pay | Admitting: Internal Medicine

## 2018-08-10 NOTE — Telephone Encounter (Signed)
Pt called to discuss a different medication since Linzess is going to be too expensive for her. Pt is aware that there is pt assistance available for Linzess and she wants another medication sent in for her.

## 2018-08-10 NOTE — Telephone Encounter (Signed)
Pt said her prescription of Linzess was too expensive and she looked online and said even with a PA it was still too high for her. She wanting to know if there's something else cheaper. Please advise. [phone]

## 2018-08-10 NOTE — Telephone Encounter (Signed)
Left Vm for pt to call in a week with progress report.  We only had #8 samples to give her.

## 2018-08-10 NOTE — Telephone Encounter (Signed)
Noted. Please call with progress report in 1-2 weeks.

## 2018-08-10 NOTE — Progress Notes (Signed)
cc'ed to pcp °

## 2018-08-12 ENCOUNTER — Ambulatory Visit: Payer: BC Managed Care – PPO | Admitting: Cardiology

## 2018-08-12 NOTE — Telephone Encounter (Signed)
Let's have her pick up a box of samples of Amitiza 8 mcg. Take 1 pill, twice a day on a FULL STOMACH/with food (to prevent nausea). Call us in 1-2 weeks with a progress report.

## 2018-08-12 NOTE — Telephone Encounter (Signed)
Noted. Pt is stopping by to pick samples up. Pt will call back.

## 2018-08-16 ENCOUNTER — Other Ambulatory Visit (HOSPITAL_COMMUNITY): Payer: Self-pay | Admitting: Family Medicine

## 2018-08-16 DIAGNOSIS — Z1231 Encounter for screening mammogram for malignant neoplasm of breast: Secondary | ICD-10-CM

## 2018-08-23 ENCOUNTER — Other Ambulatory Visit: Payer: Self-pay

## 2018-08-23 ENCOUNTER — Ambulatory Visit (HOSPITAL_COMMUNITY)
Admission: RE | Admit: 2018-08-23 | Discharge: 2018-08-23 | Disposition: A | Discharge status: Home / Self Care | Payer: BC Managed Care – PPO | Source: Ambulatory Visit | Attending: Family Medicine | Admitting: Family Medicine

## 2018-08-23 DIAGNOSIS — Z1231 Encounter for screening mammogram for malignant neoplasm of breast: Secondary | ICD-10-CM | POA: Diagnosis not present

## 2018-09-01 ENCOUNTER — Telehealth: Payer: Self-pay | Admitting: Internal Medicine

## 2018-09-01 ENCOUNTER — Telehealth: Payer: Self-pay

## 2018-09-01 NOTE — Telephone Encounter (Signed)
fowarding to EG as he requested 72 hrs.

## 2018-09-01 NOTE — Progress Notes (Signed)
Patient called and has arranged transportation for colonoscopy. We will proceed at this time.  Proceed with TCS on propofol/MAC with Dr. Gala Romney in near future: the risks, benefits, and alternatives have been discussed with the patient in detail. The patient states understanding and desires to proceed.  The patient is currently on Ambien and Xarelto.  No other anticoagulants, anxiolytics, chronic pain medications, antidepressants, diabetes medications, or iron supplements.  We will reach out to cardiology to get the okay to hold Xarelto for 48 hours.  We will plan for the procedure on propofol/MAC to promote adequate sedation.

## 2018-09-01 NOTE — Telephone Encounter (Signed)
Forwarding to AM for clearance

## 2018-09-01 NOTE — Telephone Encounter (Signed)
48 hours should be sufficient.

## 2018-09-01 NOTE — Telephone Encounter (Signed)
Patient states her parents have agree'd to provide transportation. Patient also on xarelto. Please advise EG.

## 2018-09-01 NOTE — Telephone Encounter (Signed)
Dr. Harl Bowie, pt is due for a colonoscopy with Dr. Gala Romney. Is it ok to hold Xarelto  72 hours prior to procedure? Please advise.

## 2018-09-01 NOTE — Telephone Encounter (Signed)
Pt saw EG on 08/09/2018 and has made arrangements with transportation. She is ready to schedule her colonoscopy. She prefers it be done on a Monday. Please call [phone]

## 2018-09-01 NOTE — Telephone Encounter (Signed)
Please ask whoever managed Xarelto if we can hold it for 72 hours prior. (likely Dr. Harl Bowie with [facility].)  TCS with RMR on propofol/MAC (full note on order in the form of an addendum to original OV)

## 2018-09-01 NOTE — Telephone Encounter (Signed)
Noted. Routing to EG and MS.

## 2018-09-02 NOTE — Telephone Encounter (Signed)
Noted printed out with encounter sheet. We will call patient to schedule once we receive schedule

## 2018-09-02 NOTE — Telephone Encounter (Signed)
48 hours is ok

## 2018-09-06 ENCOUNTER — Other Ambulatory Visit: Payer: Self-pay

## 2018-09-06 DIAGNOSIS — Z1211 Encounter for screening for malignant neoplasm of colon: Secondary | ICD-10-CM

## 2018-09-06 MED ORDER — PEG 3350-KCL-NA BICARB-NACL 420 G PO SOLR: 4000.0000 mL | ORAL | 0 refills | Status: DC

## 2018-09-06 NOTE — Telephone Encounter (Signed)
Called pt, TCS w/Propofol w/RMR scheduled for 11/08/18 at 12:00pm. Rx for prep sent to pharmacy. Orders entered.

## 2018-09-07 NOTE — Telephone Encounter (Signed)
Pre-op appt 11/04/18 at 2:15pm. Covid test appt 11/04/18 at 3:15pm. Appt letters mailed with procedure instructions.

## 2018-09-16 ENCOUNTER — Telehealth: Payer: Self-pay | Admitting: Internal Medicine

## 2018-09-16 ENCOUNTER — Telehealth: Payer: Self-pay

## 2018-09-16 DIAGNOSIS — K5900 Constipation, unspecified: Secondary | ICD-10-CM

## 2018-09-16 NOTE — Telephone Encounter (Signed)
-----   Message from Arnoldo Lenis, MD sent at 09/15/2018  3:29 PM EDT ----- Heart monitor does not show any afib but does have some extra heart beats at times, sometimes can have several in a row. This is benign but can cause some palpitations. Is she still having issues with symptoms, if so we can adjust her meds. If doing ok then would continue with current meds   Zandra Abts MD

## 2018-09-16 NOTE — Telephone Encounter (Signed)
Pt made aware of monitor results. Voiced understanding. She wanted to cancel upcoming appt. She will call to rs.

## 2018-09-16 NOTE — Telephone Encounter (Signed)
Pt was calling to follow up with the nurse about her paperwork regarding her medications. Please call [phone]

## 2018-09-16 NOTE — Telephone Encounter (Signed)
Spoke with pt. Pt took Amitiza samples and feels that her bowels didn't move well. Pt would like to submit PA for Linzess. Linzess has worked well for pt. PA for Linzess 72 mcg was submitted through [email or web address]. waiting on an approval or denial.

## 2018-09-17 NOTE — Telephone Encounter (Signed)
PA for Linzess was denied through [employer]. Pt's insurance company says pt hasn't tried and failed Trulance. Pt is aware that PA was denied.

## 2018-09-21 NOTE — Telephone Encounter (Signed)
Unfortunately we do not have samples of Trulance. If she's ok with trying it, I can send in an Rx for 1 month to see if it helps. If it doesn't help, we can re-try PA for Linzess.

## 2018-09-22 MED ORDER — TRULANCE 3 MG PO TABS: 3.0000 mg | ORAL_TABLET | Freq: Every day | ORAL | 1 refills | Status: DC

## 2018-09-22 NOTE — Addendum Note (Signed)
Addended by: Gordy Levan, Waver Dibiasio A on: 09/22/2018 04:45 PM   Modules accepted: Orders

## 2018-09-22 NOTE — Telephone Encounter (Signed)
Rx sent to pharmacy. Please call with progress report in 2 weeks.

## 2018-09-22 NOTE — Telephone Encounter (Signed)
Spoke with pt and pt will try Trulance.  Please send RX to [facility].

## 2018-09-22 NOTE — Telephone Encounter (Signed)
Noted  

## 2018-09-24 NOTE — Telephone Encounter (Signed)
PA for Trulance was approved through [email or web address]. pt is aware of approval. When approval letter is received, it will be scanned in chart.

## 2018-10-12 ENCOUNTER — Ambulatory Visit: Payer: BC Managed Care – PPO | Admitting: Nurse Practitioner

## 2018-11-01 ENCOUNTER — Ambulatory Visit: Payer: BC Managed Care – PPO | Admitting: Cardiology

## 2018-11-04 ENCOUNTER — Encounter (HOSPITAL_COMMUNITY)
Admission: RE | Admit: 2018-11-04 | Discharge: 2018-11-04 | Disposition: A | Discharge status: Home / Self Care | Payer: BC Managed Care – PPO | Source: Ambulatory Visit | Attending: Internal Medicine | Admitting: Internal Medicine

## 2018-11-04 ENCOUNTER — Other Ambulatory Visit (HOSPITAL_COMMUNITY)
Admission: RE | Admit: 2018-11-04 | Discharge: 2018-11-04 | Disposition: A | Discharge status: Home / Self Care | Payer: BC Managed Care – PPO | Source: Ambulatory Visit | Attending: Internal Medicine | Admitting: Internal Medicine

## 2018-11-04 ENCOUNTER — Other Ambulatory Visit: Payer: Self-pay

## 2018-11-04 DIAGNOSIS — Z01818 Encounter for other preprocedural examination: Secondary | ICD-10-CM | POA: Insufficient documentation

## 2018-11-05 ENCOUNTER — Other Ambulatory Visit: Payer: Self-pay

## 2018-11-05 ENCOUNTER — Other Ambulatory Visit (HOSPITAL_COMMUNITY)
Admission: RE | Admit: 2018-11-05 | Discharge: 2018-11-05 | Disposition: A | Discharge status: Home / Self Care | Payer: BC Managed Care – PPO | Source: Ambulatory Visit | Attending: Internal Medicine | Admitting: Internal Medicine

## 2018-11-05 DIAGNOSIS — Z20828 Contact with and (suspected) exposure to other viral communicable diseases: Secondary | ICD-10-CM | POA: Diagnosis not present

## 2018-11-05 DIAGNOSIS — Z01812 Encounter for preprocedural laboratory examination: Secondary | ICD-10-CM | POA: Insufficient documentation

## 2018-11-05 LAB — SARS CORONAVIRUS 2 (TAT 6-24 HRS): SARS Coronavirus 2: NEGATIVE

## 2018-11-08 ENCOUNTER — Ambulatory Visit (HOSPITAL_COMMUNITY): Payer: BC Managed Care – PPO | Admitting: Anesthesiology

## 2018-11-08 ENCOUNTER — Ambulatory Visit (HOSPITAL_COMMUNITY)
Admission: RE | Admit: 2018-11-08 | Discharge: 2018-11-08 | Disposition: A | Discharge status: Home / Self Care | Payer: BC Managed Care – PPO | Attending: Internal Medicine | Admitting: Internal Medicine

## 2018-11-08 ENCOUNTER — Other Ambulatory Visit: Payer: Self-pay

## 2018-11-08 ENCOUNTER — Encounter (HOSPITAL_COMMUNITY)
Admission: RE | Disposition: A | Discharge status: Home / Self Care | Payer: Self-pay | Source: Home / Self Care | Attending: Internal Medicine

## 2018-11-08 ENCOUNTER — Encounter (HOSPITAL_COMMUNITY): Payer: Self-pay | Admitting: *Deleted

## 2018-11-08 DIAGNOSIS — D125 Benign neoplasm of sigmoid colon: Secondary | ICD-10-CM | POA: Diagnosis not present

## 2018-11-08 DIAGNOSIS — D120 Benign neoplasm of cecum: Secondary | ICD-10-CM | POA: Insufficient documentation

## 2018-11-08 DIAGNOSIS — Z7901 Long term (current) use of anticoagulants: Secondary | ICD-10-CM | POA: Insufficient documentation

## 2018-11-08 DIAGNOSIS — Z1211 Encounter for screening for malignant neoplasm of colon: Secondary | ICD-10-CM

## 2018-11-08 DIAGNOSIS — I10 Essential (primary) hypertension: Secondary | ICD-10-CM | POA: Diagnosis not present

## 2018-11-08 DIAGNOSIS — Z79899 Other long term (current) drug therapy: Secondary | ICD-10-CM | POA: Diagnosis not present

## 2018-11-08 DIAGNOSIS — D124 Benign neoplasm of descending colon: Secondary | ICD-10-CM | POA: Insufficient documentation

## 2018-11-08 DIAGNOSIS — E7800 Pure hypercholesterolemia, unspecified: Secondary | ICD-10-CM | POA: Insufficient documentation

## 2018-11-08 DIAGNOSIS — K635 Polyp of colon: Secondary | ICD-10-CM

## 2018-11-08 DIAGNOSIS — Z79890 Hormone replacement therapy (postmenopausal): Secondary | ICD-10-CM | POA: Insufficient documentation

## 2018-11-08 DIAGNOSIS — Z8249 Family history of ischemic heart disease and other diseases of the circulatory system: Secondary | ICD-10-CM | POA: Insufficient documentation

## 2018-11-08 DIAGNOSIS — K5730 Diverticulosis of large intestine without perforation or abscess without bleeding: Secondary | ICD-10-CM | POA: Diagnosis not present

## 2018-11-08 DIAGNOSIS — Z6841 Body Mass Index (BMI) 40.0 and over, adult: Secondary | ICD-10-CM | POA: Insufficient documentation

## 2018-11-08 DIAGNOSIS — I4891 Unspecified atrial fibrillation: Secondary | ICD-10-CM | POA: Insufficient documentation

## 2018-11-08 HISTORY — PX: COLONOSCOPY WITH PROPOFOL: SHX5780

## 2018-11-08 HISTORY — PX: POLYPECTOMY: SHX5525

## 2018-11-08 SURGERY — COLONOSCOPY WITH PROPOFOL
Anesthesia: General

## 2018-11-08 MED ORDER — CHLORHEXIDINE GLUCONATE CLOTH 2 % EX PADS: 6.0000 | MEDICATED_PAD | Freq: Once | CUTANEOUS | Status: DC

## 2018-11-08 MED ORDER — PROPOFOL 10 MG/ML IV BOLUS
INTRAVENOUS | Status: DC | PRN
  Administered 2018-11-08 (×7): 10 mg via INTRAVENOUS

## 2018-11-08 MED ORDER — MIDAZOLAM HCL 2 MG/2ML IJ SOLN: 0.5000 mg | Freq: Once | INTRAMUSCULAR | Status: DC | PRN

## 2018-11-08 MED ORDER — LACTATED RINGERS IV SOLN: INTRAVENOUS | Status: DC

## 2018-11-08 MED ORDER — HYDROMORPHONE HCL 1 MG/ML IJ SOLN: 0.2500 mg | INTRAMUSCULAR | Status: DC | PRN

## 2018-11-08 MED ORDER — PROMETHAZINE HCL 25 MG/ML IJ SOLN: 6.2500 mg | INTRAMUSCULAR | Status: DC | PRN

## 2018-11-08 MED ORDER — HYDROCODONE-ACETAMINOPHEN 7.5-325 MG PO TABS: 1.0000 | ORAL_TABLET | Freq: Once | ORAL | Status: DC | PRN

## 2018-11-08 MED ORDER — PROPOFOL 500 MG/50ML IV EMUL
INTRAVENOUS | Status: DC | PRN
  Administered 2018-11-08: 150 ug/kg/min via INTRAVENOUS

## 2018-11-08 MED ORDER — KETAMINE HCL 50 MG/5ML IJ SOSY
PREFILLED_SYRINGE | INTRAMUSCULAR | Status: AC
  Filled 2018-11-08: qty 5

## 2018-11-08 MED ORDER — KETAMINE HCL 10 MG/ML IJ SOLN
INTRAMUSCULAR | Status: DC | PRN
  Administered 2018-11-08: 20 mg via INTRAVENOUS

## 2018-11-08 MED ORDER — LIDOCAINE HCL (CARDIAC) PF 100 MG/5ML IV SOSY
PREFILLED_SYRINGE | INTRAVENOUS | Status: DC | PRN
  Administered 2018-11-08: 60 mg via INTRAVENOUS

## 2018-11-08 NOTE — H&P (Signed)
@LOGO @   Primary Care Physician:  Sharilyn Sites, MD Primary Gastroenterologist:  Dr. Gala Romney  Pre-Procedure History & Physical: HPI:  Wendy Moran is a 68 y.o. female here for first ever screening colonoscopy.  Alternating constipation and diarrhea for years.  No prior colonoscopy.  No family history of colon cancer.  Past Medical History:  Diagnosis Date  . Atrial fibrillation ([place])   . High cholesterol   . Hypertension     Past Surgical History:  Procedure Laterality Date  . CARDIOVERSION N/A 07/22/2013   Procedure: CARDIOVERSION;  Surgeon: Fay Records, MD;  Location: [place];  Service: Cardiovascular;  Laterality: N/A;  . TEE WITHOUT CARDIOVERSION N/A 07/22/2013   Procedure: TRANSESOPHAGEAL ECHOCARDIOGRAM (TEE);  Surgeon: Fay Records, MD;  Location: [facility] ENDOSCOPY;  Service: Cardiovascular;  Laterality: N/A;  . TUBAL LIGATION      Prior to Admission medications   Medication Sig Start Date End Date Taking? Authorizing Provider  acetaminophen (TYLENOL) 500 MG tablet Take 1,000 mg by mouth every 8 (eight) hours as needed for moderate pain.   Yes [provider]  diltiazem (CARDIZEM CD) 240 MG 24 hr capsule Take 1 capsule (240 mg total) by mouth daily. 07/01/18  Yes BranchAlphonse Guild, MD  levothyroxine (SYNTHROID, LEVOTHROID) 50 MCG tablet Take 50 mcg by mouth daily before breakfast.  05/25/17  Yes [provider]  olmesartan-hydrochlorothiazide (BENICAR HCT) 40-25 MG tablet Take 1 tablet by mouth daily.  05/14/18  Yes [provider]  rivaroxaban (XARELTO) 20 MG TABS tablet Take 1 tablet (20 mg total) by mouth daily with supper. 07/01/18  Yes BranchAlphonse Guild, MD  zolpidem (AMBIEN) 10 MG tablet Take 10 mg by mouth at bedtime as needed for sleep.   Yes [provider]    Allergies as of 09/06/2018  . (No Known Allergies)    Family History  Problem Relation Age of Onset  . Heart attack Father        x's 2  . Colon cancer Neg Hx      Social History   Socioeconomic History  . Marital status: Divorced    Spouse name: Not on file  . Number of children: Not on file  . Years of education: Not on file  . Highest education level: Not on file  Occupational History  . Not on file  Social Needs  . Financial resource strain: Not on file  . Food insecurity    Worry: Not on file    Inability: Not on file  . Transportation needs    Medical: Not on file    Non-medical: Not on file  Tobacco Use  . Smoking status: Never Smoker  . Smokeless tobacco: Never Used  Substance and Sexual Activity  . Alcohol use: Yes    Alcohol/week: 0.0 standard drinks    Comment: occas  . Drug use: No  . Sexual activity: Not on file  Lifestyle  . Physical activity    Days per week: Not on file    Minutes per session: Not on file  . Stress: Not on file  Relationships  . Social Herbalist on phone: Not on file    Gets together: Not on file    Attends religious service: Not on file    Active member of club or organization: Not on file    Attends meetings of clubs or organizations: Not on file    Relationship status: Not on file  . Intimate partner violence  Fear of current or ex partner: Not on file    Emotionally abused: Not on file    Physically abused: Not on file    Forced sexual activity: Not on file  Other Topics Concern  . Not on file  Social History Narrative  . Not on file    Review of Systems: See HPI, otherwise negative ROS  Physical Exam: BP (!) 159/82   Pulse 82   Temp 98.3 F (36.8 C) (Oral)   Resp (!) 22   Ht 5\' 7"  (1.702 m)   Wt 117.9 kg   SpO2 100%   BMI 40.72 kg/m  General:   Alert,  Well-developed, well-nourished, pleasant and cooperative in NAD  Mouth:  No deformity or lesions. Neck:  Supple; no masses or thyromegaly. No significant cervical adenopathy. Lungs:  Clear throughout to auscultation.   No wheezes, crackles, or rhonchi. No acute distress. Heart:  Regular rate and rhythm;  no murmurs, clicks, rubs,  or gallops. Abdomen: Non-distended, normal bowel sounds.  Soft and nontender without appreciable mass or hepatosplenomegaly.   Impression/Plan: 68 year old lady here for first ever screening colonoscopy.  The risks, benefits, limitations, alternatives and imponderables have been reviewed with the patient. Questions have been answered. All parties are agreeable.      Notice: This dictation was prepared with Dragon dictation along with smaller phrase technology. Any transcriptional errors that result from this process are unintentional and may not be corrected upon review.

## 2018-11-08 NOTE — Discharge Instructions (Signed)
Colonoscopy Discharge Instructions  Read the instructions outlined below and refer to this sheet in the next few weeks. These discharge instructions provide you with general information on caring for yourself after you leave the hospital. Your doctor may also give you specific instructions. While your treatment has been planned according to the most current medical practices available, unavoidable complications occasionally occur. If you have any problems or questions after discharge, call Dr. Gala Romney at [phone]. ACTIVITY  You may resume your regular activity, but move at a slower pace for the next 24 hours.   Take frequent rest periods for the next 24 hours.   Walking will help get rid of the air and reduce the bloated feeling in your belly (abdomen).   No driving for 24 hours (because of the medicine (anesthesia) used during the test).    Do not sign any important legal documents or operate any machinery for 24 hours (because of the anesthesia used during the test).  NUTRITION  Drink plenty of fluids.   You may resume your normal diet as instructed by your doctor.   Begin with a light meal and progress to your normal diet. Heavy or fried foods are harder to digest and may make you feel sick to your stomach (nauseated).   Avoid alcoholic beverages for 24 hours or as instructed.  MEDICATIONS  You may resume your normal medications unless your doctor tells you otherwise.  WHAT YOU CAN EXPECT TODAY  Some feelings of bloating in the abdomen.   Passage of more gas than usual.   Spotting of blood in your stool or on the toilet paper.  IF YOU HAD POLYPS REMOVED DURING THE COLONOSCOPY:  No aspirin products for 7 days or as instructed.   No alcohol for 7 days or as instructed.   Eat a soft diet for the next 24 hours.  FINDING OUT THE RESULTS OF YOUR TEST Not all test results are available during your visit. If your test results are not back during the visit, make an appointment  with your caregiver to find out the results. Do not assume everything is normal if you have not heard from your caregiver or the medical facility. It is important for you to follow up on all of your test results.  SEEK IMMEDIATE MEDICAL ATTENTION IF:  You have more than a spotting of blood in your stool.   Your belly is swollen (abdominal distention).   You are nauseated or vomiting.   You have a temperature over 101.   You have abdominal pain or discomfort that is severe or gets worse throughout the day.    Polyps and diverticulosis information provided  Further recommendations to follow pending review of pathology report  At patient request I called, mother, Franco Nones at [phone].  Got generic voicemail.  Did not leave a message.  Resume Xarelto tomorrow  Stop Metamucil; begin Benefiber 1 tablespoon daily for 3 weeks; then increase to twice daily thereafter  Office visit with [place] in 3 months    Colon Polyps  Polyps are tissue growths inside the body. Polyps can grow in many places, including the large intestine (colon). A polyp may be a round bump or a mushroom-shaped growth. You could have one polyp or several. Most colon polyps are noncancerous (benign). However, some colon polyps can become cancerous over time. Finding and removing the polyps early can help prevent this. What are the causes? The exact cause of colon polyps is not known. What increases the risk? You  are more likely to develop this condition if you:  Have a family history of colon cancer or colon polyps.  Are older than 56 or older than 45 if you are African American.  Have inflammatory bowel disease, such as ulcerative colitis or Crohn's disease.  Have certain hereditary conditions, such as: ? Familial adenomatous polyposis. ? Lynch syndrome. ? Turcot syndrome. ? Peutz-Jeghers syndrome.  Are overweight.  Smoke cigarettes.  Do not get enough exercise.  Drink too much alcohol.  Eat a  diet that is high in fat and red meat and low in fiber.  Had childhood cancer that was treated with abdominal radiation. What are the signs or symptoms? Most polyps do not cause symptoms. If you have symptoms, they may include:  Blood coming from your rectum when having a bowel movement.  Blood in your stool. The stool may look dark red or black.  Abdominal pain.  A change in bowel habits, such as constipation or diarrhea. How is this diagnosed? This condition is diagnosed with a colonoscopy. This is a procedure in which a lighted, flexible scope is inserted into the anus and then passed into the colon to examine the area. Polyps are sometimes found when a colonoscopy is done as part of routine cancer screening tests. How is this treated? Treatment for this condition involves removing any polyps that are found. Most polyps can be removed during a colonoscopy. Those polyps will then be tested for cancer. Additional treatment may be needed depending on the results of testing. Follow these instructions at home: Lifestyle  Maintain a healthy weight, or lose weight if recommended by your health care provider.  Exercise every day or as told by your health care provider.  Do not use any products that contain nicotine or tobacco, such as cigarettes and e-cigarettes. If you need help quitting, ask your health care provider.  If you drink alcohol, limit how much you have: ? 0-1 drink a day for women. ? 0-2 drinks a day for men.  Be aware of how much alcohol is in your drink. In the [place] one drink equals one 12 oz bottle of beer (355 mL), one 5 oz glass of wine (148 mL), or one 1 oz shot of hard liquor (44 mL). Eating and drinking   Eat foods that are high in fiber, such as fruits, vegetables, and whole grains.  Eat foods that are high in calcium and vitamin D, such as milk, cheese, yogurt, eggs, liver, fish, and broccoli.  Limit foods that are high in fat, such as fried foods and  desserts.  Limit the amount of red meat and processed meat you eat, such as hot dogs, sausage, bacon, and lunch meats. General instructions  Keep all follow-up visits as told by your health care provider. This is important. ? This includes having regularly scheduled colonoscopies. ? Talk to your health care provider about when you need a colonoscopy. Contact a health care provider if:  You have new or worsening bleeding during a bowel movement.  You have new or increased blood in your stool.  You have a change in bowel habits.  You lose weight for no known reason. Summary  Polyps are tissue growths inside the body. Polyps can grow in many places, including the colon.  Most colon polyps are noncancerous (benign), but some can become cancerous over time.  This condition is diagnosed with a colonoscopy.  Treatment for this condition involves removing any polyps that are found. Most polyps can be removed  during a colonoscopy. This information is not intended to replace advice given to you by your health care provider. Make sure you discuss any questions you have with your health care provider. Document Released: 09/19/2003 Document Revised: 04/09/2017 Document Reviewed: 04/09/2017 Elsevier Patient Education  2020 [employer].    Diverticulosis  Diverticulosis is a condition that develops when small pouches (diverticula) form in the wall of the large intestine (colon). The colon is where water is absorbed and stool is formed. The pouches form when the inside layer of the colon pushes through weak spots in the outer layers of the colon. You may have a few pouches or many of them. What are the causes? The cause of this condition is not known. What increases the risk? The following factors may make you more likely to develop this condition:  Being older than age 39. Your risk for this condition increases with age. Diverticulosis is rare among people younger than age 32. By age 74,  many people have it.  Eating a low-fiber diet.  Having frequent constipation.  Being overweight.  Not getting enough exercise.  Smoking.  Taking over-the-counter pain medicines, like aspirin and ibuprofen.  Having a family history of diverticulosis. What are the signs or symptoms? In most people, there are no symptoms of this condition. If you do have symptoms, they may include:  Bloating.  Cramps in the abdomen.  Constipation or diarrhea.  Pain in the lower left side of the abdomen. How is this diagnosed? This condition is most often diagnosed during an exam for other colon problems. Because diverticulosis usually has no symptoms, it often cannot be diagnosed independently. This condition may be diagnosed by:  Using a flexible scope to examine the colon (colonoscopy).  Taking an X-ray of the colon after dye has been put into the colon (barium enema).  Doing a CT scan. How is this treated? You may not need treatment for this condition if you have never developed an infection related to diverticulosis. If you have had an infection before, treatment may include:  Eating a high-fiber diet. This may include eating more fruits, vegetables, and grains.  Taking a fiber supplement.  Taking a live bacteria supplement (probiotic).  Taking medicine to relax your colon.  Taking antibiotic medicines. Follow these instructions at home:  Drink 6-8 glasses of water or more each day to prevent constipation.  Try not to strain when you have a bowel movement.  If you have had an infection before: ? Eat more fiber as directed by your health care provider or your diet and nutrition specialist (dietitian). ? Take a fiber supplement or probiotic, if your health care provider approves.  Take over-the-counter and prescription medicines only as told by your health care provider.  If you were prescribed an antibiotic, take it as told by your health care provider. Do not stop taking the  antibiotic even if you start to feel better.  Keep all follow-up visits as told by your health care provider. This is important. Contact a health care provider if:  You have pain in your abdomen.  You have bloating.  You have cramps.  You have not had a bowel movement in 3 days. Get help right away if:  Your pain gets worse.  Your bloating becomes very bad.  You have a fever or chills, and your symptoms suddenly get worse.  You vomit.  You have bowel movements that are bloody or black.  You have bleeding from your rectum. Summary  Diverticulosis  is a condition that develops when small pouches (diverticula) form in the wall of the large intestine (colon).  You may have a few pouches or many of them.  This condition is most often diagnosed during an exam for other colon problems.  If you have had an infection related to diverticulosis, treatment may include increasing the fiber in your diet, taking supplements, or taking medicines. This information is not intended to replace advice given to you by your health care provider. Make sure you discuss any questions you have with your health care provider. Document Released: 09/20/2003 Document Revised: 12/05/2016 Document Reviewed: 11/12/2015 Elsevier Patient Education  2020 [employer].

## 2018-11-08 NOTE — Transfer of Care (Signed)
Immediate Anesthesia Transfer of Care Note  Patient: Wendy Moran  Procedure(s) Performed: COLONOSCOPY WITH PROPOFOL (N/A ) POLYPECTOMY  Patient Location: PACU  Anesthesia Type:MAC  Level of Consciousness: awake and patient cooperative  Airway & Oxygen Therapy: Patient Spontanous Breathing  Post-op Assessment: Report given to RN and Post -op Vital signs reviewed and stable  Post vital signs: Reviewed and stable  Last Vitals:  Vitals Value Taken Time  BP 106/58 11/08/18 1324  Temp    Pulse 81 11/08/18 1326  Resp 17 11/08/18 1326  SpO2 98 % 11/08/18 1326  Vitals shown include unvalidated device data.  Last Pain:  Vitals:   11/08/18 1245  TempSrc:   PainSc: 10-Worst pain ever      Patients Stated Pain Goal: 8 (52/48/18 5909)  Complications: No apparent anesthesia complications

## 2018-11-08 NOTE — Op Note (Signed)
[facility] Patient Name: Wendy Moran Procedure Date: 11/08/2018 12:17 PM MRN: [id number] Date of Birth: November 02, 1950 Attending MD: Norvel Richards , MD CSN: [id number] Age: 68 Admit Type: Outpatient Procedure:                Colonoscopy Indications:              Screening for colorectal malignant neoplasm Providers:                Norvel Richards, MD, Jeanann Lewandowsky. Sharon Seller, RN,                            Randa Spike, Technician Referring MD:              Medicines:                Propofol per Anesthesia Complications:            No immediate complications. Estimated Blood Loss:     Estimated blood loss was minimal. Procedure:                Pre-Anesthesia Assessment:                           - Prior to the procedure, a History and Physical                            was performed, and patient medications and                            allergies were reviewed. The patient's tolerance of                            previous anesthesia was also reviewed. The risks                            and benefits of the procedure and the sedation                            options and risks were discussed with the patient.                            All questions were answered, and informed consent                            was obtained. Prior Anticoagulants: The patient                            last took Eliquis (apixaban) 2 days prior to the                            procedure. ASA Grade Assessment: II - A patient                            with mild systemic disease. After reviewing the  risks and benefits, the patient was deemed in                            satisfactory condition to undergo the procedure.                           After obtaining informed consent, the colonoscope                            was passed under direct vision. Throughout the                            procedure, the patient's blood pressure, pulse, and                oxygen saturations were monitored continuously. The                            [id number]) scope was introduced through                            the anus and advanced to the the cecum, identified                            by appendiceal orifice and ileocecal valve. The                            colonoscopy was performed without difficulty. The                            patient tolerated the procedure well. The quality                            of the bowel preparation was adequate. The                            ileocecal valve, appendiceal orifice, and rectum                            were photographed. Scope In: 12:51:37 PM Scope Out: 1:15:33 PM Scope Withdrawal Time: 0 hours 14 minutes 29 seconds  Total Procedure Duration: 0 hours 23 minutes 56 seconds  Findings:      The perianal and digital rectal examinations were normal.      Scattered medium-mouthed diverticula were found in the sigmoid colon and       descending colon.      Two semi-pedunculated polyps were found in the descending colon and       hepatic flexure. The polyps were 5 to 6 mm in size. These polyps were       removed with a cold snare. Resection and retrieval were complete.      A 13 mm polyp was found in the sigmoid colon. The polyp was       pedunculated. The polyp was removed with a hot snare. Resection and       retrieval were complete. Estimated blood loss: none.      The exam was  otherwise without abnormality on direct and retroflexion       views. Impression:               - Diverticulosis in the sigmoid colon and in the                            descending colon.                           - Two 5 to 6 mm polyps in the descending colon and                            at the hepatic flexure, removed with a cold snare.                            Resected and retrieved.                           - One 13 mm polyp in the sigmoid colon, removed                            with a hot  snare. Resected and retrieved.                           - The examination was otherwise normal on direct                            and retroflexion views. Moderate Sedation:      Moderate (conscious) sedation was personally administered by an       anesthesia professional. The following parameters were monitored: oxygen       saturation, heart rate, blood pressure, respiratory rate, EKG, adequacy       of pulmonary ventilation, and response to care. Recommendation:           - Patient has a contact number available for                            emergencies. The signs and symptoms of potential                            delayed complications were discussed with the                            patient. Return to normal activities tomorrow.                            Written discharge instructions were provided to the                            patient.                           - Resume previous diet.                           -  Continue present medications.                           - Repeat colonoscopy date to be determined after                            pending pathology results are reviewed for                            surveillance based on pathology results.                           - Return to GI office in 3 months. Stop Metamucil;                            begin Benefiber 1 tablespoon daily for 3 weeks then                            increase to twice daily thereafter Procedure Code(s):        --- Professional ---                           [id number], Colonoscopy, flexible; with removal of                            tumor(s), polyp(s), or other lesion(s) by snare                            technique Diagnosis Code(s):        --- Professional ---                           K63.5, Polyp of colon                           Z12.11, Encounter for screening for malignant                            neoplasm of colon                           K57.30, Diverticulosis of large intestine  without                            perforation or abscess without bleeding CPT copyright 2019 American Medical Association. All rights reserved. The codes documented in this report are preliminary and upon coder review may  be revised to meet current compliance requirements. Cristopher Estimable. Alease Fait, MD Norvel Richards, MD 11/08/2018 1:31:05 PM This report has been signed electronically. Number of Addenda: 0

## 2018-11-08 NOTE — Anesthesia Postprocedure Evaluation (Signed)
Anesthesia Post Note  Patient: Wendy Moran  Procedure(s) Performed: COLONOSCOPY WITH PROPOFOL (N/A ) POLYPECTOMY  Patient location during evaluation: PACU Anesthesia Type: MAC Level of consciousness: awake and alert Pain management: pain level controlled Vital Signs Assessment: post-procedure vital signs reviewed and stable Respiratory status: spontaneous breathing Cardiovascular status: stable Anesthetic complications: no     Last Vitals:  Vitals:   11/08/18 1013  BP: (!) 159/82  Pulse: 82  Resp: (!) 22  Temp: 36.8 C  SpO2: 100%    Last Pain:  Vitals:   11/08/18 1245  TempSrc:   PainSc: 10-Worst pain ever                 Everette Rank

## 2018-11-08 NOTE — Anesthesia Preprocedure Evaluation (Addendum)
Anesthesia Evaluation  Patient identified by MRN, date of birth, ID band Patient awake    Reviewed: Allergy & Precautions, NPO status , Patient's Chart, lab work & pertinent test results  Airway Mallampati: II  TM Distance: >3 FB Neck ROM: Full    Dental no notable dental hx.    Pulmonary neg pulmonary ROS,    Pulmonary exam normal breath sounds clear to auscultation       Cardiovascular Exercise Tolerance: Good hypertension, Pt. on medications + DOE  Normal cardiovascular exam+ dysrhythmias Atrial Fibrillation I Rhythm:Regular Rate:Normal  Reports good ET +DOE with stairs  Denies CP/MI States has had DCCV x1 in 2015 - no issues since  Last ECG trace appears sinus   Neuro/Psych  Headaches, negative psych ROS   GI/Hepatic negative GI ROS, Neg liver ROS,   Endo/Other  Morbid obesity  Renal/GU negative Renal ROS  negative genitourinary   Musculoskeletal negative musculoskeletal ROS (+)   Abdominal   Peds negative pediatric ROS (+)  Hematology negative hematology ROS (+)   Anesthesia Other Findings   Reproductive/Obstetrics negative OB ROS                            Anesthesia Physical Anesthesia Plan  ASA: III  Anesthesia Plan: General   Post-op Pain Management:    Induction: Intravenous  PONV Risk Score and Plan: 3 and TIVA, Propofol infusion, Ondansetron and Treatment may vary due to age or medical condition  Airway Management Planned: Simple Face Mask and Nasal Cannula  Additional Equipment:   Intra-op Plan:   Post-operative Plan:   Informed Consent: I have reviewed the patients History and Physical, chart, labs and discussed the procedure including the risks, benefits and alternatives for the proposed anesthesia with the patient or authorized representative who has indicated his/her understanding and acceptance.     Dental advisory given  Plan Discussed with:  CRNA  Anesthesia Plan Comments: (Plan Full PPE use  Plan GA with GETA as needed d/w pt -WTP with same after Q&A)        Anesthesia Quick Evaluation

## 2018-11-09 ENCOUNTER — Encounter: Payer: Self-pay | Admitting: Internal Medicine

## 2018-11-09 LAB — SURGICAL PATHOLOGY

## 2018-11-11 ENCOUNTER — Encounter (HOSPITAL_COMMUNITY): Payer: Self-pay | Admitting: Internal Medicine

## 2018-11-16 DIAGNOSIS — I4891 Unspecified atrial fibrillation: Secondary | ICD-10-CM | POA: Diagnosis not present

## 2018-11-16 DIAGNOSIS — I10 Essential (primary) hypertension: Secondary | ICD-10-CM | POA: Diagnosis not present

## 2019-02-08 ENCOUNTER — Ambulatory Visit (INDEPENDENT_AMBULATORY_CARE_PROVIDER_SITE_OTHER): Payer: BC Managed Care – PPO | Admitting: Nurse Practitioner

## 2019-02-08 ENCOUNTER — Encounter: Payer: Self-pay | Admitting: Nurse Practitioner

## 2019-02-08 ENCOUNTER — Other Ambulatory Visit: Payer: Self-pay

## 2019-02-08 DIAGNOSIS — R5383 Other fatigue: Secondary | ICD-10-CM | POA: Diagnosis not present

## 2019-02-08 DIAGNOSIS — R197 Diarrhea, unspecified: Secondary | ICD-10-CM | POA: Diagnosis not present

## 2019-02-08 DIAGNOSIS — R1030 Lower abdominal pain, unspecified: Secondary | ICD-10-CM | POA: Diagnosis not present

## 2019-02-08 DIAGNOSIS — R109 Unspecified abdominal pain: Secondary | ICD-10-CM | POA: Insufficient documentation

## 2019-02-08 NOTE — Assessment & Plan Note (Signed)
The patient has had, essentially since November at her colonoscopy, intermittent but persistent diarrhea.  She has been averaging 2-3 stools a day at minimum and up to 8-10 stools a day on particularly bad days.  No recent antibiotics, sick exposures, travel, new or undercooked foods.  At this point I will check stool studies including C. difficile and GI pathogen panel.  Also check a metabolic panel and a CBC.  After she dropped off her stool studies I recommend she start a probiotic once a day for the next 2 to 3 months.  If her stool studies are negative we can start her on Bentyl.  This seems quite reminiscent with postinfectious IBS given her recommend abdominal pain as per above as well as acute onset.  Follow-up in 2 months.  Call for any worsening symptoms.

## 2019-02-08 NOTE — Progress Notes (Signed)
Referring Provider: Sharilyn Sites, MD Primary Care Physician:  Sharilyn Sites, MD Primary GI:  Dr. Gala Romney  Chief Complaint  Patient presents with  . Follow-up    3 mo fu pp TCS,tired,diarrhea    HPI:   Wendy Moran is a 69 y.o. female who presents for post procedure follow-up.  The patient was last seen in our office 08/09/2018 for constipation.  On her initial referral she was complaining of abdominal pain, gas, constipation for 1 month.  She was diagnosed with IBS by primary care and recommended align, fiber, Gas-X and referral to GI.  At her visit she noted that she was not wanting to have a colonoscopy.  Admitted gas and constipation with straining and hard stools followed by diarrhea after initial hard stool.  Abdominal discomfort improved after bowel movement.  Notes incomplete emptying.  No other overt GI complaints.  Recommended Linzess 72 mcg daily, progress for 1 to 2 weeks, recommended having a first ever colonoscopy when she is able to arrange transportation, follow-up in 2 months.  She called our office 08/10/2018 indicating Linzess too expensive and we recommended trial of Amitiza 8 mcg twice daily with another requested progress report.  She follow-up 09/16/2018 medicating Amitiza not very effective.  She decided to try to submit a PA for Linzess which was sent to [email or web address].  The PA was denied and indicated the patient needed to try and fail Trulance first.  A prescription for Trulance was sent to her pharmacy.  No further communication about constipation.  The patient eventually was able to arrange transportation she was set up for colonoscopy on propofol.  Clearance from cardiology to hold Xarelto for 48 hours was given.  Colonoscopy was completed 11/08/2018 and found diverticulosis in the sigmoid colon and descending colon, two 5 to 6 mm polyps in the descending colon and hepatic flexure, a single 13 mm polyp in the sigmoid colon, otherwise normal.  Surgical pathology  found the polyps to be tubular adenoma in the descending colon and cecal polyps and tubulovillous adenoma with focal high-grade dysplasia but negative for carcinoma in the sigmoid polyp.  Recommended stop Metamucil, start Benefiber once a day for 3 weeks and increase to twice a day after that.  Recommended repeat colonoscopy in 3 years.  Today she states she's doing ok overall. Since her colonoscopy she has been tired and having diarrhea. States she's been alternating between constipation and diarrhea. Will have loose stools during the week and then on the weekend feels constipated. This past Sunday she had a lot of diarrhea with 2 stools in the morning and in the afternoon was reminiscent of when she took her bowel prep. Has had 3-4 loose stools since then. Overall if she is able to have a bowel movement, stools will generally be loose. Also with a lot of gas. Has been going on since November. Has intermittent lower crampy abdominal pain which improves after a bowel movement. Denies N/V, hematochezia, melena, fever, chills, unintentional weight loss. Denies recent travel, sick contacts, new or undercooked foods. Denies URI or flu-like symptoms. Denies loss of sense of taste or smell. Denies chest pain, dyspnea, dizziness, lightheadedness, syncope, near syncope. Denies any other upper or lower GI symptoms.  Has also been having fatigue recently as well.  Past Medical History:  Diagnosis Date  . Atrial fibrillation ([place])   . High cholesterol   . Hypertension     Past Surgical History:  Procedure Laterality Date  . CARDIOVERSION N/A 07/22/2013  Procedure: CARDIOVERSION;  Surgeon: Fay Records, MD;  Location: [facility];  Service: Cardiovascular;  Laterality: N/A;  . COLONOSCOPY WITH PROPOFOL N/A 11/08/2018   Procedure: COLONOSCOPY WITH PROPOFOL;  Surgeon: Daneil Dolin, MD;  [facility];  Service: Endoscopy;  Laterality: N/A;  12:00pm  . POLYPECTOMY  11/08/2018   Procedure:  POLYPECTOMY;  Surgeon: Daneil Dolin, MD;  [facility];  Service: Endoscopy;;  colon  . TEE WITHOUT CARDIOVERSION N/A 07/22/2013   Procedure: TRANSESOPHAGEAL ECHOCARDIOGRAM (TEE);  Surgeon: Fay Records, MD;  Location[facility] END[facility]COPY;  Service: Cardiovascular;  Laterality: N/A;  . TUBAL LIGATION      Current Outpatient Medications  Medication Sig Dispense Refill  . acetaminophen (TYLENOL) 500 MG tablet Take 1,000 mg by mouth every 8 (eight) hours as needed for moderate pain.    Marland Kitchen diltiazem (CARDIZEM CD) 240 MG 24 hr capsule Take 1 capsule (240 mg total) by mouth daily. 90 capsule 3  . levothyroxine (SYNTHROID, LEVOTHROID) 50 MCG tablet Take 50 mcg by mouth daily before breakfast.   10  . olmesartan-hydrochlorothiazide (BENICAR HCT) 40-25 MG tablet Take 1 tablet by mouth daily.     . rivaroxaban (XARELTO) 20 MG TABS tablet Take 1 tablet (20 mg total) by mouth daily with supper. 90 tablet 3  . zolpidem (AMBIEN) 10 MG tablet Take 10 mg by mouth at bedtime as needed for sleep.     No current facility-administered medications for this visit.    Allergies as of 02/08/2019  . (No Known Allergies)    Family History  Problem Relation Age of Onset  . Heart attack Father        x's 2  . Colon cancer Neg Hx     Social History   Socioeconomic History  . Marital status: Divorced    Spouse name: Not on file  . Number of children: Not on file  . Years of education: Not on file  . Highest education level: Not on file  Occupational History  . Not on file  Tobacco Use  . Smoking status: Never Smoker  . Smokeless tobacco: Never Used  Substance and Sexual Activity  . Alcohol use: Yes    Alcohol/week: 0.0 standard drinks    Comment: occas  . Drug use: No  . Sexual activity: Not on file  Other Topics Concern  . Not on file  Social History Narrative  . Not on file   Social Determinants of Health   Financial Resource Strain:   . Difficulty of Paying Living Expenses: Not on  file  Food Insecurity:   . Worried About Charity fundraiser in the Last Year: Not on file  . Ran Out of Food in the Last Year: Not on file  Transportation Needs:   . Lack of Transportation (Medical): Not on file  . Lack of Transportation (Non-Medical): Not on file  Physical Activity:   . Days of Exercise per Week: Not on file  . Minutes of Exercise per Session: Not on file  Stress:   . Feeling of Stress : Not on file  Social Connections:   . Frequency of Communication with Friends and Family: Not on file  . Frequency of Social Gatherings with Friends and Family: Not on file  . Attends Religious Services: Not on file  . Active Member of Clubs or Organizations: Not on file  . Attends Archivist Meetings: Not on file  . Marital Status: Not on file  Review of Systems: General: Negative for anorexia, weight loss, fever, chills, weakness. ENT: Negative for hoarseness, difficulty swallowing. CV: Negative for chest pain, angina, palpitations, peripheral edema.  Respiratory: Negative for dyspnea at rest, cough, sputum, wheezing.  GI: See history of present illness. Endo: Negative for unusual weight change.  Heme: Negative for bruising or bleeding. Allergy: Negative for rash or hives.   Physical Exam: BP (!) 143/78   Pulse 91   Temp (!) 96.8 F (36 C) (Temporal)   Ht 5\' 7"  (1.702 m)   Wt 274 lb 6.4 oz (124.5 kg)   BMI 42.98 kg/m  General:   Alert and oriented. Pleasant and cooperative. Well-nourished and well-developed.  Eyes:  Without icterus, sclera clear and conjunctiva pink.  Ears:  Normal auditory acuity. Cardiovascular:  S1, S2 present without murmurs appreciated. Extremities without clubbing or edema. Respiratory:  Clear to auscultation bilaterally. No wheezes, rales, or rhonchi. No distress.  Gastrointestinal:  +BS, soft, non-tender and non-distended. No HSM noted. No guarding or rebound. No masses appreciated.  Rectal:  Deferred  Musculoskalatal:   Symmetrical without gross deformities. Skin:  Intact without significant lesions or rashes. Neurologic:  Alert and oriented x4;  grossly normal neurologically. Psych:  Alert and cooperative. Normal mood and affect. Heme/Lymph/Immune: No excessive bruising noted.    02/08/2019 2:55 PM   Disclaimer: This note was dictated with voice recognition software. Similar sounding words can inadvertently be transcribed and may not be corrected upon review.

## 2019-02-08 NOTE — Assessment & Plan Note (Signed)
The patient is complaints of fatigue as well as weight gain.  She states this is been significant and ongoing.  I will check a CBC and a TSH today.  Further recommendations to follow.  If no obvious abnormality on her labs recommend follow-up with primary care for further investigation.

## 2019-02-08 NOTE — Patient Instructions (Signed)
Your health issues we discussed today were:   Fatigue and weight gain: 1. Have your labs drawn [place] 2. Further recommendations will be made after lab results 3. There is nothing in your labs to explain your fatigue you would likely want to follow-up with your primary care provider to further discuss  Abdominal pain and diarrhea: 1. Have your blood test from able to 2. Collect your stool studies (they must be loose/liquid stool) and bring them to the lab 3. We will call you with the results 4. After you drop off your stool studies you can start taking a probiotic. 5. Good luck with brands such as align, restora, and [facility] colon health 6. If there is no infection in your stool studies we can call in medication to help with your abdominal cramping and diarrhea 7. Call us if you have any worsening or severe symptoms  Overall I recommend:  1. Continue your other current medications 2. Return for follow-up in 2 months 3. Call us if you have any questions or concerns   ---------------------------------------------------------------  COVID-19 Vaccine Information can be found at: [email or web address].[email or web address] For questions related to vaccine distribution or appointments, please email [email or web address] or call [phone].   ---------------------------------------------------------------   At [facility] we value your feedback. You may receive a survey about your visit today. Please share your experience as we strive to create trusting relationships with our patients to provide genuine, compassionate, quality care.  We appreciate your understanding and patience as we review any laboratory studies, imaging, and other diagnostic tests that are ordered as we care for you. Our office policy is 5 business days for review of these results, and any emergent or urgent results are addressed in a timely manner for your best  interest. If you do not hear from our office in 1 week, please contact us.   We also encourage the use of MyChart, which contains your medical information for your review as well. If you are not enrolled in this feature, an access code is on this after visit summary for your convenience. Thank you for allowing [place] to be involved in your care.  It was great to see you today!  I hope you have a great day!!

## 2019-02-08 NOTE — Assessment & Plan Note (Signed)
Essentially new lower abdominal crampy abdominal pain that improves after a bowel movement associated with diarrhea as per above.  As noted above it seems a bit reminiscent of postinfectious IBS.  Stool studies and CBC have been ordered.  We can consider Bentyl or other treatment options such as Lomotil, Immodium, Xifaxan, etc. we will call her when we get results.  Follow-up in 2 months and call for any worsening or severe symptoms.  Colonoscopy recently completed and found polyps with recommended 3-year repeat.

## 2019-02-09 ENCOUNTER — Encounter: Payer: Self-pay | Admitting: Internal Medicine

## 2019-02-09 DIAGNOSIS — R5383 Other fatigue: Secondary | ICD-10-CM | POA: Diagnosis not present

## 2019-02-09 DIAGNOSIS — R1030 Lower abdominal pain, unspecified: Secondary | ICD-10-CM | POA: Diagnosis not present

## 2019-02-09 DIAGNOSIS — R197 Diarrhea, unspecified: Secondary | ICD-10-CM | POA: Diagnosis not present

## 2019-02-09 LAB — CBC WITH DIFFERENTIAL/PLATELET
Absolute Monocytes: 614 cells/uL (ref 200–950)
Basophils Absolute: 42 cells/uL (ref 0–200)
Basophils Relative: 0.4 %
Eosinophils Absolute: 146 cells/uL (ref 15–500)
Eosinophils Relative: 1.4 %
HCT: 36.8 % (ref 35.0–45.0)
Hemoglobin: 12.4 g/dL (ref 11.7–15.5)
Lymphs Abs: 2444 cells/uL (ref 850–3900)
MCH: 28.6 pg (ref 27.0–33.0)
MCHC: 33.7 g/dL (ref 32.0–36.0)
MCV: 85 fL (ref 80.0–100.0)
MPV: 9.7 fL (ref 7.5–12.5)
Monocytes Relative: 5.9 %
Neutro Abs: 7155 cells/uL (ref 1500–7800)
Neutrophils Relative %: 68.8 %
Platelets: 355 10*3/uL (ref 140–400)
RBC: 4.33 10*6/uL (ref 3.80–5.10)
RDW: 13.6 % (ref 11.0–15.0)
Total Lymphocyte: 23.5 %
WBC: 10.4 10*3/uL (ref 3.8–10.8)

## 2019-02-09 LAB — COMPREHENSIVE METABOLIC PANEL
AG Ratio: 1.2 (calc) (ref 1.0–2.5)
ALT: 14 U/L (ref 6–29)
AST: 13 U/L (ref 10–35)
Albumin: 4.2 g/dL (ref 3.6–5.1)
Alkaline phosphatase (APISO): 78 U/L (ref 37–153)
BUN/Creatinine Ratio: 32 (calc) — ABNORMAL HIGH (ref 6–22)
BUN: 29 mg/dL — ABNORMAL HIGH (ref 7–25)
CO2: 30 mmol/L (ref 20–32)
Calcium: 9.4 mg/dL (ref 8.6–10.4)
Chloride: 102 mmol/L (ref 98–110)
Creat: 0.92 mg/dL (ref 0.50–0.99)
Globulin: 3.6 g/dL (calc) (ref 1.9–3.7)
Glucose, Bld: 101 mg/dL — ABNORMAL HIGH (ref 65–99)
Potassium: 3.9 mmol/L (ref 3.5–5.3)
Sodium: 142 mmol/L (ref 135–146)
Total Bilirubin: 0.3 mg/dL (ref 0.2–1.2)
Total Protein: 7.8 g/dL (ref 6.1–8.1)

## 2019-02-09 LAB — TSH: TSH: 1.85 mIU/L (ref 0.40–4.50)

## 2019-02-09 NOTE — Progress Notes (Signed)
Cc'ed to pcp °

## 2019-02-16 LAB — GASTROINTESTINAL PATHOGEN PANEL PCR
C. difficile Tox A/B, PCR: NOT DETECTED
Campylobacter, PCR: NOT DETECTED
Cryptosporidium, PCR: NOT DETECTED
E coli (ETEC) LT/ST PCR: NOT DETECTED
E coli (STEC) stx1/stx2, PCR: NOT DETECTED
E coli 0157, PCR: NOT DETECTED
Giardia lamblia, PCR: NOT DETECTED
Norovirus, PCR: NOT DETECTED
Rotavirus A, PCR: NOT DETECTED
Salmonella, PCR: NOT DETECTED
Shigella, PCR: NOT DETECTED

## 2019-02-16 LAB — C. DIFFICILE GDH AND TOXIN A/B
GDH ANTIGEN: NOT DETECTED
MICRO NUMBER:: 10112631
SPECIMEN QUALITY:: ADEQUATE
TOXIN A AND B: NOT DETECTED

## 2019-04-20 ENCOUNTER — Ambulatory Visit: Payer: BC Managed Care – PPO | Admitting: Nurse Practitioner

## 2019-05-26 DIAGNOSIS — R7309 Other abnormal glucose: Secondary | ICD-10-CM | POA: Diagnosis not present

## 2019-05-26 DIAGNOSIS — G4700 Insomnia, unspecified: Secondary | ICD-10-CM | POA: Diagnosis not present

## 2019-05-26 DIAGNOSIS — R7301 Impaired fasting glucose: Secondary | ICD-10-CM | POA: Diagnosis not present

## 2019-05-26 DIAGNOSIS — I10 Essential (primary) hypertension: Secondary | ICD-10-CM | POA: Diagnosis not present

## 2019-05-26 DIAGNOSIS — E7849 Other hyperlipidemia: Secondary | ICD-10-CM | POA: Diagnosis not present

## 2019-05-26 DIAGNOSIS — Z6839 Body mass index (BMI) 39.0-39.9, adult: Secondary | ICD-10-CM | POA: Diagnosis not present

## 2019-06-16 ENCOUNTER — Other Ambulatory Visit: Payer: Self-pay

## 2019-06-16 ENCOUNTER — Ambulatory Visit (INDEPENDENT_AMBULATORY_CARE_PROVIDER_SITE_OTHER): Payer: BC Managed Care – PPO | Admitting: Cardiology

## 2019-06-16 ENCOUNTER — Encounter: Payer: Self-pay | Admitting: *Deleted

## 2019-06-16 ENCOUNTER — Encounter: Payer: Self-pay | Admitting: Cardiology

## 2019-06-16 VITALS — BP 152/84 | HR 79 | Ht 67.0 in | Wt 278.6 lb

## 2019-06-16 DIAGNOSIS — I10 Essential (primary) hypertension: Secondary | ICD-10-CM

## 2019-06-16 DIAGNOSIS — I4891 Unspecified atrial fibrillation: Secondary | ICD-10-CM | POA: Diagnosis not present

## 2019-06-16 MED ORDER — RIVAROXABAN 20 MG PO TABS: 20.0000 mg | ORAL_TABLET | Freq: Every day | ORAL | 3 refills | Status: DC

## 2019-06-16 MED ORDER — DILTIAZEM HCL ER COATED BEADS 300 MG PO CP24: 300.0000 mg | ORAL_CAPSULE | Freq: Every day | ORAL | 1 refills | Status: DC

## 2019-06-16 NOTE — Patient Instructions (Signed)
Your physician wants you to follow-up in: [place] will receive a reminder letter in the mail two months in advance. If you don't receive a letter, please call our office to schedule the follow-up appointment.  Your physician has recommended you make the following change in your medication:   INCREASE DILTIAZEM 300 MG DAILY   Thank you for choosing [facility]!!

## 2019-06-16 NOTE — Progress Notes (Signed)
Clinical Summary Wendy Moran is a 69 y.o.female seen today for follow up of the following medical problems.   1. Afib - s/p DCCV 07/2013 - on xarelto and diltiazem.  - she reports prior negative sleep study   08/2018 monitor PACs, PSVT, PVCs, short runs NSVT. No afib  - some palpitations with exertion.  - no bleeding on xarelto  2. Mitral regurgitation - moderate by TEE 07/2013 - repeat TTE 12/2014 showed only mild MR   3. HTN - she is compliant with med - 17 lbs weight gain since last year looks to have increased her bp    SH:   Harvie Heck had to put to sleep few years ago, remains emotional about it. 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   Past Medical History:  Diagnosis Date  . Atrial fibrillation ([place])   . High cholesterol   . Hypertension      No Known Allergies   Current Outpatient Medications  Medication Sig Dispense Refill  . acetaminophen (TYLENOL) 500 MG tablet Take 1,000 mg by mouth every 8 (eight) hours as needed for moderate pain.    Marland Kitchen diltiazem (CARDIZEM CD) 240 MG 24 hr capsule Take 1 capsule (240 mg total) by mouth daily. 90 capsule 3  . levothyroxine (SYNTHROID, LEVOTHROID) 50 MCG tablet Take 50 mcg by mouth daily before breakfast.   10  . olmesartan-hydrochlorothiazide (BENICAR HCT) 40-25 MG tablet Take 1 tablet by mouth daily.     . rivaroxaban (XARELTO) 20 MG TABS tablet Take 1 tablet (20 mg total) by mouth daily with supper. 90 tablet 3  . zolpidem (AMBIEN) 10 MG tablet Take 10 mg by mouth at bedtime as needed for sleep.     No current facility-administered medications for this visit.     Past Surgical History:  Procedure Laterality Date  . CARDIOVERSION N/A 07/22/2013   Procedure: CARDIOVERSION;  Surgeon: Fay Records, MD;  Location: [facility];  Service: Cardiovascular;  Laterality: N/A;  . COLONOSCOPY WITH PROPOFOL N/A 11/08/2018   Procedure:  COLONOSCOPY WITH PROPOFOL;  Surgeon: Daneil Dolin, MD;  [facility];  Service: Endoscopy;  Laterality: N/A;  12:00pm  . POLYPECTOMY  11/08/2018   Procedure: POLYPECTOMY;  Surgeon: Daneil Dolin, MD;  [facility];  Service: Endoscopy;;  colon  . TEE WITHOUT CARDIOVERSION N/A 07/22/2013   Procedure: TRANSESOPHAGEAL ECHOCARDIOGRAM (TEE);  Surgeon: Fay Records, MD;  Location: [facility] END[facility]COPY;  Service: Cardiovascular;  Laterality: N/A;  . TUBAL LIGATION       No Known Allergies    Family History  Problem Relation Age of Onset  . Heart attack Father        x's 2  . Colon cancer Neg Hx      Social History Wendy Moran reports that she has never smoked. She has never used smokeless tobacco. Wendy Moran reports current alcohol use.   Review of Systems CONSTITUTIONAL: No weight loss, fever, chills, weakness or fatigue.  HEENT: Eyes: No visual loss, blurred vision, double vision or yellow sclerae.No hearing loss, sneezing, congestion, runny nose or sore throat.  SKIN: No rash or itching.  CARDIOVASCULAR: per hpi RESPIRATORY: No shortness of breath, cough or sputum.  GASTROINTESTINAL: No anorexia, nausea, vomiting or diarrhea. No abdominal pain or blood.  GENITOURINARY: No burning on urination, no polyuria NEUROLOGICAL: No headache, dizziness, syncope, paralysis, ataxia, numbness or tingling in the extremities.  No change in bowel or bladder control.  MUSCULOSKELETAL: No muscle, back pain, joint pain or stiffness.  LYMPHATICS: No enlarged nodes. No history of splenectomy.  PSYCHIATRIC: No history of depression or anxiety.  ENDOCRINOLOGIC: No reports of sweating, cold or heat intolerance. No polyuria or polydipsia.  Marland Kitchen   Physical Examination Today's Vitals   06/16/19 1516  BP: (!) 152/84  Pulse: 79  SpO2: 94%  Weight: 278 lb 9.6 oz (126.4 kg)  Height: 5\' 7"  (1.702 m)   Body mass index is 43.63 kg/m.  Gen: resting comfortably, no acute  distress HEENT: no scleral icterus, pupils equal round and reactive, no palptable cervical adenopathy,  CV: RRR, no m/r,g, no jvd Resp: Clear to auscultation bilaterally GI: abdomen is soft, non-tender, non-distended, normal bowel sounds, no hepatosplenomegaly MSK: extremities are warm, no edema.  Skin: warm, no rash Neuro:  no focal deficits Psych: appropriate affect   Diagnostic Studies 07/2013 TEE Study Conclusions  - Left ventricle: No evidence of thrombus. - Left atrium: No evidence of thrombus in the atrial cavity or appendage. No evidence of thrombus in the atrial cavity or appendage.  Impressions:  - Successful cardioversion. No cardiac source of emboli was indentified. Left ventricle: LVEF is normal. No evidence of thrombus.  ------------------------------------------------------------------- Aortic valve: AV is normal. No AI.  ------------------------------------------------------------------- Mitral valve: MV is normal. There is moderate MR.  ------------------------------------------------------------------- Left atrium: No evidence of thrombus in the atrial cavity or appendage. No evidence of thrombus in the atrial cavity or appendage.  ------------------------------------------------------------------- Right ventricle: RVEF is normal.  ------------------------------------------------------------------- Tricuspid valve: TV is normal. MIld TR.   12/2014 TTE Study Conclusions  - Left ventricle: The cavity size was normal. Wall thickness was  normal. Systolic function was normal. The estimated ejection  fraction was in the range of 60% to 65%. Wall motion was normal;  there were no regional wall motion abnormalities. Left  ventricular diastolic function parameters were normal. - Aortic valve: Valve area (VTI): 3.74 cm^2. Valve area (Vmax):  3.49 cm^2. Valve area (Vmean): 3.65 cm^2. - Atrial septum: No defect or patent foramen ovale was  identified. - Technially adequate study.  05/2015 Nuclear stress test  There was no ST segment deviation noted during stress.  The study is normal. No myocardial ischemia or scar.  This is a low risk study.  Nuclear stress EF: 52%  08/2018 heart monitor  12 day holter monitor report  Min HR 55, Max HR 161, Avg HR 85  Occasional supraventricular ectopy in the form of isolated PACs, coupltes. Multiple runs of SVT longest 22 beats, probable atach.  Occasional ventricular ectopy (2.2% burden) in the form of isolated PVCs, bigeminy, trigeminy, couplets. 6 short runs of NSVT longest 7 beats.  Reported symptoms corresponded with supraventricular and ventricular ectopy as reported above.  Assessment and Plan  1. Afib - some ongoing palpitations, will increase dilt to 300mg  daily - continue xarelto     2. HTN - uptrend likely due to weight gain since last year - increasing dilt as reported above.       Arnoldo Lenis, M.D.

## 2019-11-03 ENCOUNTER — Other Ambulatory Visit (HOSPITAL_COMMUNITY): Payer: Self-pay | Admitting: Family Medicine

## 2019-11-03 DIAGNOSIS — Z1231 Encounter for screening mammogram for malignant neoplasm of breast: Secondary | ICD-10-CM

## 2019-11-14 DIAGNOSIS — E7849 Other hyperlipidemia: Secondary | ICD-10-CM | POA: Diagnosis not present

## 2019-11-14 DIAGNOSIS — I10 Essential (primary) hypertension: Secondary | ICD-10-CM | POA: Diagnosis not present

## 2019-11-14 DIAGNOSIS — I4891 Unspecified atrial fibrillation: Secondary | ICD-10-CM | POA: Diagnosis not present

## 2019-11-14 DIAGNOSIS — Z23 Encounter for immunization: Secondary | ICD-10-CM | POA: Diagnosis not present

## 2019-11-14 DIAGNOSIS — Z6839 Body mass index (BMI) 39.0-39.9, adult: Secondary | ICD-10-CM | POA: Diagnosis not present

## 2019-11-14 DIAGNOSIS — E039 Hypothyroidism, unspecified: Secondary | ICD-10-CM | POA: Diagnosis not present

## 2019-11-14 DIAGNOSIS — R7309 Other abnormal glucose: Secondary | ICD-10-CM | POA: Diagnosis not present

## 2019-12-12 ENCOUNTER — Ambulatory Visit (HOSPITAL_COMMUNITY)
Admission: RE | Admit: 2019-12-12 | Discharge: 2019-12-12 | Disposition: A | Discharge status: Home / Self Care | Payer: BC Managed Care – PPO | Source: Ambulatory Visit | Attending: Family Medicine | Admitting: Family Medicine

## 2019-12-12 ENCOUNTER — Other Ambulatory Visit: Payer: Self-pay

## 2019-12-12 DIAGNOSIS — Z1231 Encounter for screening mammogram for malignant neoplasm of breast: Secondary | ICD-10-CM | POA: Insufficient documentation

## 2019-12-15 NOTE — Progress Notes (Signed)
Cardiology Office Note    Date:  12/17/2019   ID:  Wendy Moran, DOB May 26, 1950, MRN [id number]  PCP:  Sharilyn Sites, MD  Cardiologist: Carlyle Dolly, MD    Chief Complaint  Patient presents with  . Follow-up    6 month visit    History of Present Illness:    Wendy Moran is a 69 y.o. female with past medical history of paorxysmal atrial fibrillation (s/p DCCV in 2015 with repeat monitor in 08/2018 showing SVT, PACs and PVCs with no recurrent atrial fibrillation), mitral regurgitation and HTN who presents to the office today for 55-month follow-up  She was last examined by Dr. Harl Bowie in 06/2019 and reported occasional palpitations at that time but denied any recent chest pain or dyspnea on exertion.  Given her palpitations, Cardizem CD was increased to 300 mg daily and she was continued on Xarelto 20 mg daily for anticoagulation.  In talking with the patient today, she reports her palpitations have overall been well-controlled. She does experience occasional sternal discomfort but this resolves with belching and she is interested in PPI therapy. Uses gas relief medications with improvement in symptoms as well. Denies any exertional component to her symptoms. Breathing has been at baseline and she denies any orthopnea or PND. Does experience intermittent lower extremity edema but says she sits for 8+ hours a day at her job. She does have a small dog named Peanut and she is hopeful to resume a routine walking schedule with taking him outside.   Past Medical History:  Diagnosis Date  . Atrial fibrillation ([place])   . High cholesterol   . Hypertension     Past Surgical History:  Procedure Laterality Date  . CARDIOVERSION N/A 07/22/2013   Procedure: CARDIOVERSION;  Surgeon: Fay Records, MD;  Location: [facility];  Service: Cardiovascular;  Laterality: N/A;  . COLONOSCOPY WITH PROPOFOL N/A 11/08/2018   Procedure: COLONOSCOPY WITH PROPOFOL;  Surgeon: Daneil Dolin, MD;   [facility];  Service: Endoscopy;  Laterality: N/A;  12:00pm  . POLYPECTOMY  11/08/2018   Procedure: POLYPECTOMY;  Surgeon: Daneil Dolin, MD;  [facility];  Service: Endoscopy;;  colon  . TEE WITHOUT CARDIOVERSION N/A 07/22/2013   Procedure: TRANSESOPHAGEAL ECHOCARDIOGRAM (TEE);  Surgeon: Fay Records, MD;  Location: [facility];  Service: Cardiovascular;  Laterality: N/A;  . TUBAL LIGATION      Current Medications: Outpatient Medications Prior to Visit  Medication Sig Dispense Refill  . acetaminophen (TYLENOL) 500 MG tablet Take 1,000 mg by mouth every 8 (eight) hours as needed for moderate pain.    Marland Kitchen olmesartan-hydrochlorothiazide (BENICAR HCT) 40-25 MG tablet Take 1 tablet by mouth daily.     . Probiotic Product (PROBIOTIC DAILY PO) Take 2 tablets by mouth daily. Fiber One gummies    . zolpidem (AMBIEN) 10 MG tablet Take 10 mg by mouth at bedtime as needed for sleep.    Marland Kitchen diltiazem (CARDIZEM CD) 300 MG 24 hr capsule Take 1 capsule (300 mg total) by mouth daily. 90 capsule 1  . rivaroxaban (XARELTO) 20 MG TABS tablet Take 1 tablet (20 mg total) by mouth daily with supper. 90 tablet 3  . levothyroxine (SYNTHROID, LEVOTHROID) 50 MCG tablet Take 50 mcg by mouth daily before breakfast.   10   No facility-administered medications prior to visit.     Allergies:   Patient has no known allergies.   Social History   Socioeconomic History  . Marital status: Divorced  Spouse name: Not on file  . Number of children: Not on file  . Years of education: Not on file  . Highest education level: Not on file  Occupational History  . Not on file  Tobacco Use  . Smoking status: Never Smoker  . Smokeless tobacco: Never Used  Vaping Use  . Vaping Use: Never used  Substance and Sexual Activity  . Alcohol use: Yes    Alcohol/week: 0.0 standard drinks    Comment: occas  . Drug use: No  . Sexual activity: Not on file  Other Topics Concern  . Not on file  Social  History Narrative  . Not on file   Social Determinants of Health   Financial Resource Strain: Not on file  Food Insecurity: Not on file  Transportation Needs: Not on file  Physical Activity: Not on file  Stress: Not on file  Social Connections: Not on file     Family History:  The patient's family history includes Heart attack in her father.   Review of Systems:   Please see the history of present illness.     General:  No chills, fever, night sweats or weight changes.  Cardiovascular:  No exertional chest pain, dyspnea on exertion, edema, orthopnea, palpitations, paroxysmal nocturnal dyspnea. Dermatological: No rash, lesions/masses Respiratory: No cough, dyspnea Urologic: No hematuria, dysuria Abdominal:   No nausea, vomiting, diarrhea, bright red blood per rectum, melena, or hematemesis. Positive for intermittent bloating and sternal discomfort.  Neurologic:  No visual changes, wkns, changes in mental status. All other systems reviewed and are otherwise negative except as noted above.   Physical Exam:    VS:  BP 128/80   Pulse 80   Ht 5\' 7"  (1.702 m)   Wt 280 lb (127 kg)   SpO2 97%   BMI 43.85 kg/m    General: Well developed, well nourished,female appearing in no acute distress. Head: Normocephalic, atraumatic. Neck: No carotid bruits. JVD not elevated.  Lungs: Respirations regular and unlabored, without wheezes or rales.  Heart: Regular rate and rhythm. No S3 or S4.  No murmur, no rubs, or gallops appreciated. Abdomen: Appears non-distended. No obvious abdominal masses. Msk:  Strength and tone appear normal for age. No obvious joint deformities or effusions. Extremities: No clubbing or cyanosis. Trace ankle edema bilaterally.  Distal pedal pulses are 2+ bilaterally. Neuro: Alert and oriented X 3. Moves all extremities spontaneously. No focal deficits noted. Psych:  Responds to questions appropriately with a normal affect. Skin: No rashes or lesions noted  Wt  Readings from Last 3 Encounters:  12/16/19 280 lb (127 kg)  06/16/19 278 lb 9.6 oz (126.4 kg)  02/08/19 274 lb 6.4 oz (124.5 kg)     Studies/Labs Reviewed:   EKG:  EKG is not ordered today.  Recent Labs: 02/08/2019: ALT 14; BUN 29; Creat 0.92; Hemoglobin 12.4; Platelets 355; Potassium 3.9; Sodium 142; TSH 1.85   Lipid Panel No results found for: CHOL, TRIG, HDL, CHOLHDL, VLDL, LDLCALC, LDLDIRECT  Additional studies/ records that were reviewed today include:   Echocardiogram: 12/2014 Study Conclusions   - Left ventricle: The cavity size was normal. Wall thickness was  normal. Systolic function was normal. The estimated ejection  fraction was in the range of 60% to 65%. Wall motion was normal;  there were no regional wall motion abnormalities. Left  ventricular diastolic function parameters were normal.  - Aortic valve: Valve area (VTI): 3.74 cm^2. Valve area (Vmax):  3.49 cm^2. Valve area (Vmean): 3.65 cm^2.  -  Atrial septum: No defect or patent foramen ovale was identified.  - Technially adequate study.   Event Monitor: 08/2018  12 day holter monitor report  Min HR 55, Max HR 161, Avg HR 85  Occasional supraventricular ectopy in the form of isolated PACs, coupltes. Multiple runs of SVT longest 22 beats, probable atach.  Occasional ventricular ectopy (2.2% burden) in the form of isolated PVCs, bigeminy, trigeminy, couplets. 6 short runs of NSVT longest 7 beats.  Reported symptoms corresponded with supraventricular and ventricular ectopy as reported above.    Assessment:    1. Paroxysmal atrial fibrillation (HCC)   2. Atypical chest pain   3. Essential hypertension   4. Nonrheumatic mitral valve regurgitation      Plan:   In order of problems listed above:  1. Paroxysmal Atrial Fibrillation - She previously underwent DCCV in 2015 and most recent monitor in 08/2018 showed SVT, PACs and PVCs with no recurrent atrial fibrillation. - Her palpitations have  overall been well-controlled. Continue Cardizem CD 300mg  daily for rate-control. - She denies any evidence of active bleeding. Remains on Xarelto 20mg  daily for anticoagulation. Will request most recent labs from PCP.   2. Atypical Chest Pain - She describes occasional sternal discomfort but this resolves with belching and she also uses gas relief medications with improvement in symptoms. No exertional component.  - Her symptoms overall seem most consistent with acid reflux and she is interested in PPI therapy. Will start Protonix 20mg  daily. I encouraged her to make [place] aware if symptoms do not improve as repeat stress testing could be pursued (previously had a low-risk NST in 05/2015).   3. HTN - BP is well-controlled at 128/80 during today's visit. Continue current medication regimen with Cardizem CD 300mg  daily and Olmesartan-HCTZ 40-25mg  daily.   4. Mitral Regurgitation - Trivial by echo in 2016. A murmur is not evident on examination today.    Medication Adjustments/Labs and Tests Ordered: Current medicines are reviewed at length with the patient today.  Concerns regarding medicines are outlined above.  Medication changes, Labs and Tests ordered today are listed in the Patient Instructions below. Patient Instructions  Medication Instructions:  START  Protonix 20 mg daily   *If you need a refill on your cardiac medications before your next appointment, please call your pharmacy*   Lab Work: None none  If you have labs (blood work) drawn today and your tests are completely normal, you will receive your results only by: Marland Kitchen MyChart Message (if you have MyChart) OR . A paper copy in the mail If you have any lab test that is abnormal or we need to change your treatment, we will call you to review the results.   Testing/Procedures: None today    Follow-Up: At [facility], you and your health needs are our priority.  As part of our continuing mission to provide you with  exceptional heart care, we have created designated Provider Care Teams.  These Care Teams include your primary Cardiologist (physician) and Advanced Practice Providers (APPs -  Physician Assistants and Nurse Practitioners) who all work together to provide you with the care you need, when you need it.  We recommend signing up for the patient portal called "MyChart".  Sign up information is provided on this After Visit Summary.  MyChart is used to connect with patients for Virtual Visits (Telemedicine).  Patients are able to view lab/test results, encounter notes, upcoming appointments, etc.  Non-urgent messages can be sent to your provider as well.  To learn more about what you can do with MyChart, go to [email or web address].    Your next appointment:   6 month(s)  The format for your next appointment:   In Person  Provider:   Carlyle Dolly, MD   Other Instructions None     Thank you for choosing [facility] !            Signed, Erma Heritage, PA-C  12/17/2019 9:43 AM    [place] [address] [place], [place] [address] Phone: [phone] Fax: [phone]

## 2019-12-16 ENCOUNTER — Encounter: Payer: Self-pay | Admitting: Student

## 2019-12-16 ENCOUNTER — Ambulatory Visit (INDEPENDENT_AMBULATORY_CARE_PROVIDER_SITE_OTHER): Payer: BC Managed Care – PPO | Admitting: Student

## 2019-12-16 ENCOUNTER — Other Ambulatory Visit: Payer: Self-pay

## 2019-12-16 ENCOUNTER — Ambulatory Visit: Payer: BC Managed Care – PPO | Admitting: Cardiology

## 2019-12-16 VITALS — BP 128/80 | HR 80 | Ht 67.0 in | Wt 280.0 lb

## 2019-12-16 DIAGNOSIS — I340 Nonrheumatic mitral (valve) insufficiency: Secondary | ICD-10-CM

## 2019-12-16 DIAGNOSIS — I10 Essential (primary) hypertension: Secondary | ICD-10-CM

## 2019-12-16 DIAGNOSIS — R0789 Other chest pain: Secondary | ICD-10-CM | POA: Diagnosis not present

## 2019-12-16 DIAGNOSIS — I4891 Unspecified atrial fibrillation: Secondary | ICD-10-CM

## 2019-12-16 DIAGNOSIS — I480 Paroxysmal atrial fibrillation: Secondary | ICD-10-CM

## 2019-12-16 MED ORDER — PANTOPRAZOLE SODIUM 20 MG PO TBEC: 20.0000 mg | DELAYED_RELEASE_TABLET | Freq: Every day | ORAL | 3 refills | Status: DC

## 2019-12-16 MED ORDER — RIVAROXABAN 20 MG PO TABS: 20.0000 mg | ORAL_TABLET | Freq: Every day | ORAL | 3 refills | Status: DC

## 2019-12-16 MED ORDER — DILTIAZEM HCL ER COATED BEADS 300 MG PO CP24: 300.0000 mg | ORAL_CAPSULE | Freq: Every day | ORAL | 3 refills | Status: DC

## 2019-12-16 NOTE — Patient Instructions (Signed)
Medication Instructions:  START  Protonix 20 mg daily   *If you need a refill on your cardiac medications before your next appointment, please call your pharmacy*   Lab Work: None none  If you have labs (blood work) drawn today and your tests are completely normal, you will receive your results only by: Marland Kitchen MyChart Message (if you have MyChart) OR . A paper copy in the mail If you have any lab test that is abnormal or we need to change your treatment, we will call you to review the results.   Testing/Procedures: None today    Follow-Up: At [facility], you and your health needs are our priority.  As part of our continuing mission to provide you with exceptional heart care, we have created designated Provider Care Teams.  These Care Teams include your primary Cardiologist (physician) and Advanced Practice Providers (APPs -  Physician Assistants and Nurse Practitioners) who all work together to provide you with the care you need, when you need it.  We recommend signing up for the patient portal called "MyChart".  Sign up information is provided on this After Visit Summary.  MyChart is used to connect with patients for Virtual Visits (Telemedicine).  Patients are able to view lab/test results, encounter notes, upcoming appointments, etc.  Non-urgent messages can be sent to your provider as well.   To learn more about what you can do with MyChart, go to [email or web address].    Your next appointment:   6 month(s)  The format for your next appointment:   In Person  Provider:   Carlyle Dolly, MD   Other Instructions None     Thank you for choosing [facility] !

## 2019-12-17 ENCOUNTER — Encounter: Payer: Self-pay | Admitting: Student

## 2019-12-29 ENCOUNTER — Ambulatory Visit: Payer: BC Managed Care – PPO | Admitting: Cardiology

## 2020-05-10 IMAGING — MG DIGITAL SCREENING BILATERAL MAMMOGRAM WITH TOMO AND CAD
6 of 10 series · 6 of 30 positions shown · non-contrast
Comparison: Previous exam(s).

CLINICAL DATA: Screening.

EXAM:
DIGITAL SCREENING BILATERAL MAMMOGRAM WITH TOMO AND CAD

[R CC synth-2D]
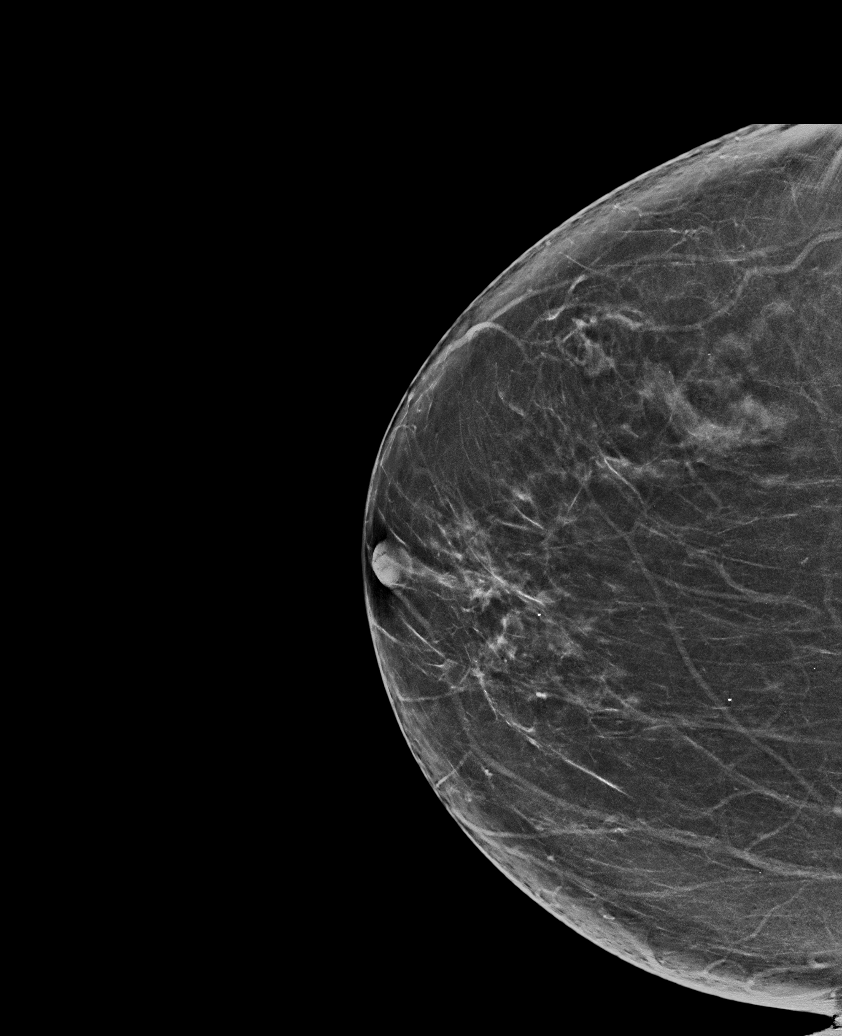

[L CC synth-2D]
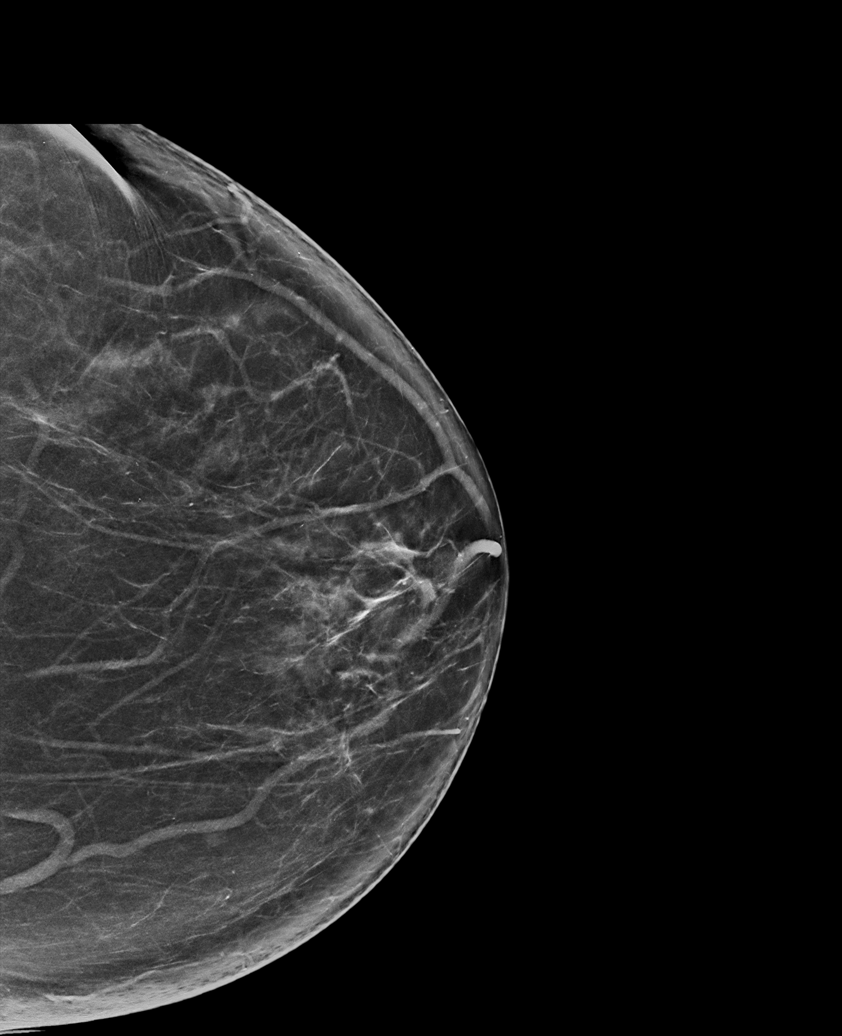

[R MLO synth-2D (1 of 2)]
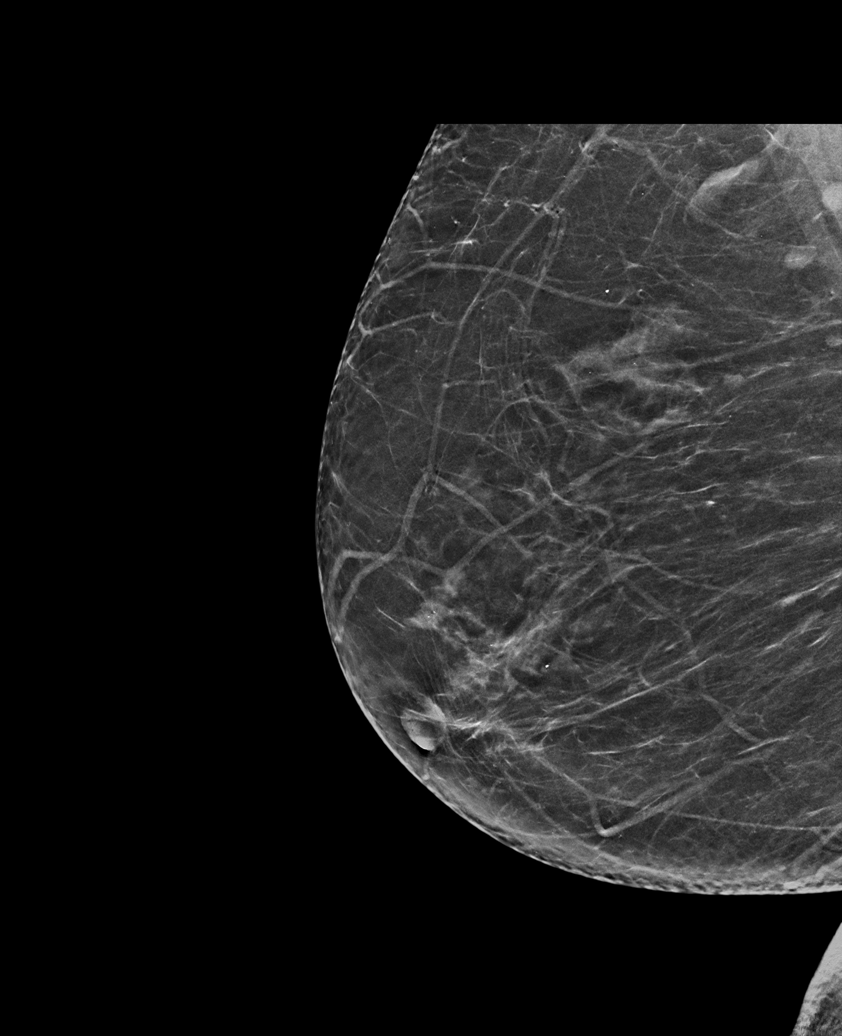

[R MLO synth-2D (2 of 2)]
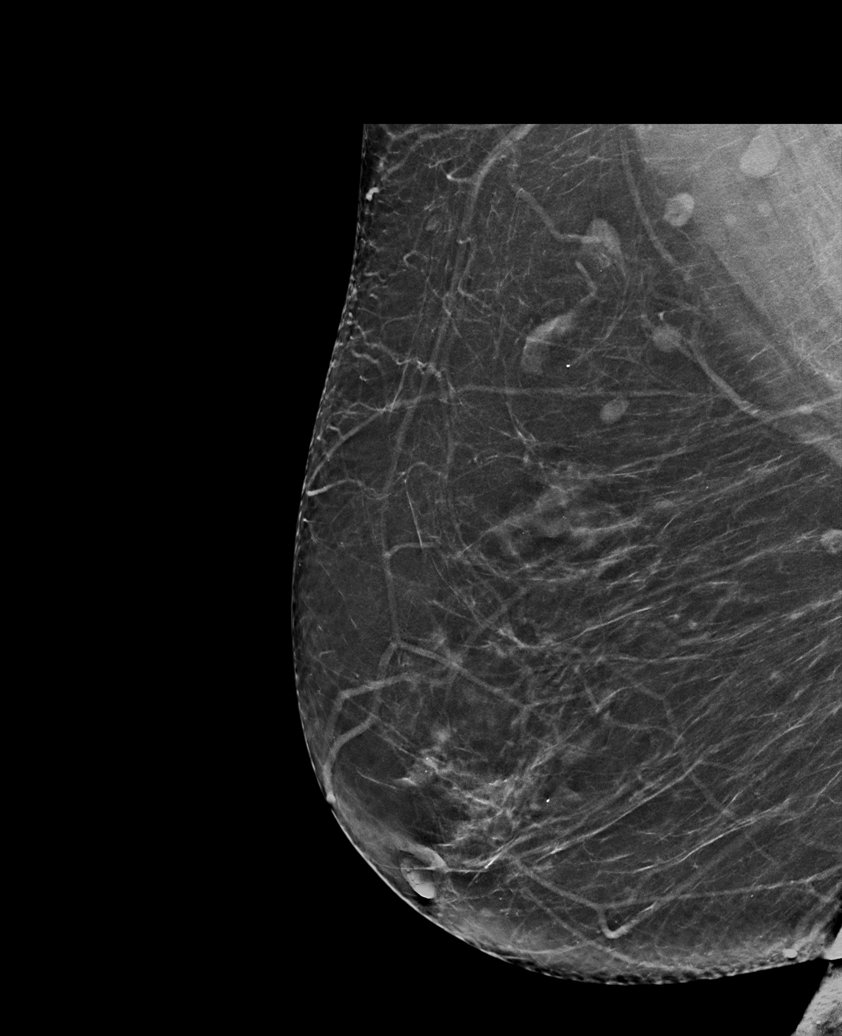

[L MLO synth-2D]
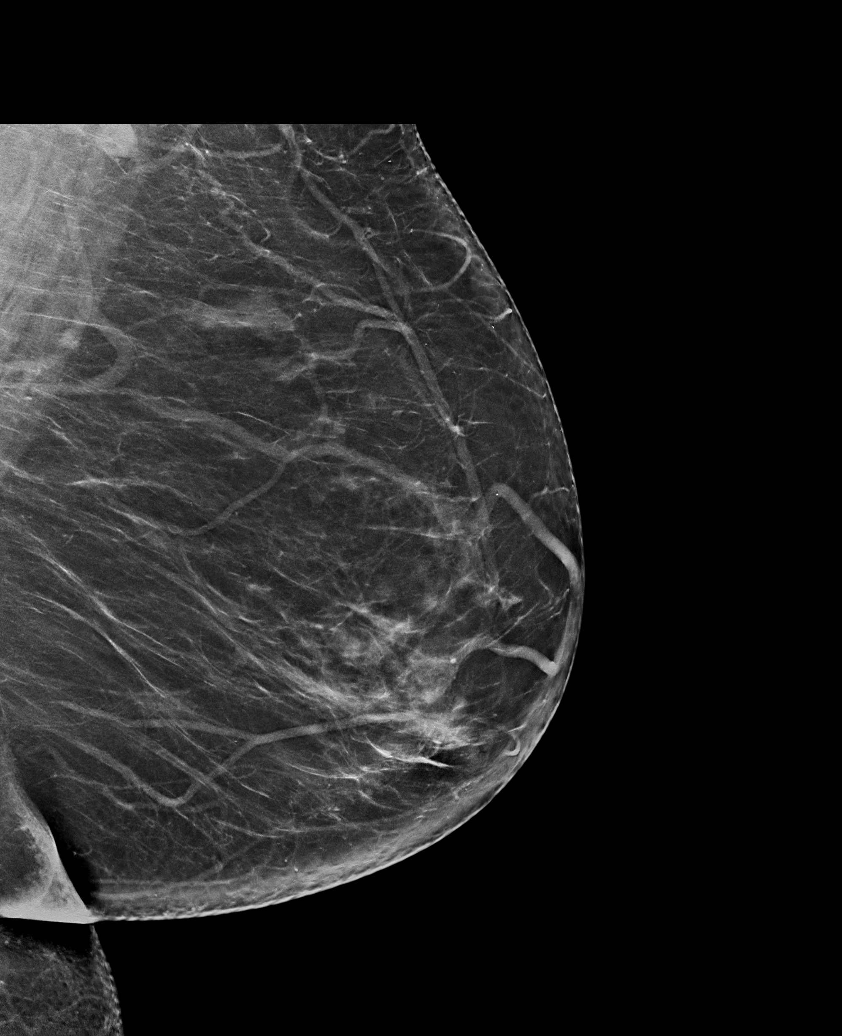

[R MLO tomo · tomo slice 37/72.0]
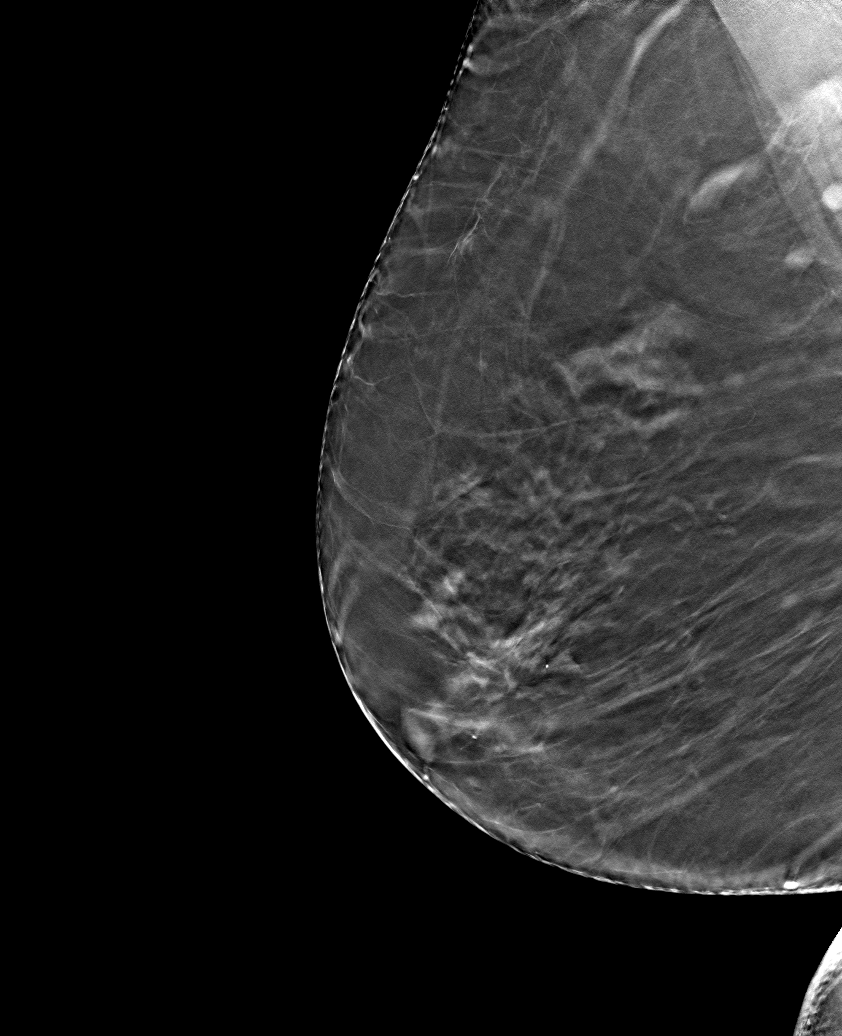

[6 of 30 positions shown; findings below may reference images not displayed]

ACR Breast Density Category b: There are scattered areas of
fibroglandular density.
FINDINGS: There are no findings suspicious for malignancy. Images were
processed with CAD.
IMPRESSION: No mammographic evidence of malignancy. A result letter of this
screening mammogram will be mailed directly to the patient.

RECOMMENDATION:
Screening mammogram in one year. (Code:[id number])

BI-RADS CATEGORY  1: Negative.

## 2020-06-13 DIAGNOSIS — E039 Hypothyroidism, unspecified: Secondary | ICD-10-CM | POA: Diagnosis not present

## 2020-06-13 DIAGNOSIS — Z1331 Encounter for screening for depression: Secondary | ICD-10-CM | POA: Diagnosis not present

## 2020-06-13 DIAGNOSIS — I10 Essential (primary) hypertension: Secondary | ICD-10-CM | POA: Diagnosis not present

## 2020-06-13 DIAGNOSIS — R7309 Other abnormal glucose: Secondary | ICD-10-CM | POA: Diagnosis not present

## 2020-06-13 DIAGNOSIS — Z6841 Body Mass Index (BMI) 40.0 and over, adult: Secondary | ICD-10-CM | POA: Diagnosis not present

## 2020-06-13 DIAGNOSIS — Z1389 Encounter for screening for other disorder: Secondary | ICD-10-CM | POA: Diagnosis not present

## 2020-06-13 DIAGNOSIS — Z23 Encounter for immunization: Secondary | ICD-10-CM | POA: Diagnosis not present

## 2020-06-13 DIAGNOSIS — E7849 Other hyperlipidemia: Secondary | ICD-10-CM | POA: Diagnosis not present

## 2020-06-13 DIAGNOSIS — I4891 Unspecified atrial fibrillation: Secondary | ICD-10-CM | POA: Diagnosis not present

## 2020-07-23 ENCOUNTER — Emergency Department (HOSPITAL_COMMUNITY)
Admission: EM | Admit: 2020-07-23 | Discharge: 2020-07-23 | Disposition: A | Discharge status: Home / Self Care | Payer: BC Managed Care – PPO | Attending: Emergency Medicine | Admitting: Emergency Medicine

## 2020-07-23 ENCOUNTER — Emergency Department (HOSPITAL_COMMUNITY): Payer: BC Managed Care – PPO

## 2020-07-23 ENCOUNTER — Other Ambulatory Visit: Payer: Self-pay

## 2020-07-23 DIAGNOSIS — N898 Other specified noninflammatory disorders of vagina: Secondary | ICD-10-CM | POA: Diagnosis not present

## 2020-07-23 DIAGNOSIS — N858 Other specified noninflammatory disorders of uterus: Secondary | ICD-10-CM

## 2020-07-23 DIAGNOSIS — K828 Other specified diseases of gallbladder: Secondary | ICD-10-CM | POA: Diagnosis not present

## 2020-07-23 DIAGNOSIS — Z79899 Other long term (current) drug therapy: Secondary | ICD-10-CM | POA: Diagnosis not present

## 2020-07-23 DIAGNOSIS — R1031 Right lower quadrant pain: Secondary | ICD-10-CM | POA: Diagnosis not present

## 2020-07-23 DIAGNOSIS — R1032 Left lower quadrant pain: Secondary | ICD-10-CM | POA: Insufficient documentation

## 2020-07-23 DIAGNOSIS — K5790 Diverticulosis of intestine, part unspecified, without perforation or abscess without bleeding: Secondary | ICD-10-CM | POA: Diagnosis not present

## 2020-07-23 DIAGNOSIS — R102 Pelvic and perineal pain: Secondary | ICD-10-CM

## 2020-07-23 DIAGNOSIS — R1030 Lower abdominal pain, unspecified: Secondary | ICD-10-CM | POA: Diagnosis not present

## 2020-07-23 DIAGNOSIS — Z7901 Long term (current) use of anticoagulants: Secondary | ICD-10-CM | POA: Insufficient documentation

## 2020-07-23 DIAGNOSIS — I10 Essential (primary) hypertension: Secondary | ICD-10-CM | POA: Diagnosis not present

## 2020-07-23 DIAGNOSIS — N8500 Endometrial hyperplasia, unspecified: Secondary | ICD-10-CM | POA: Diagnosis not present

## 2020-07-23 DIAGNOSIS — R9389 Abnormal findings on diagnostic imaging of other specified body structures: Secondary | ICD-10-CM | POA: Diagnosis not present

## 2020-07-23 DIAGNOSIS — Z6841 Body Mass Index (BMI) 40.0 and over, adult: Secondary | ICD-10-CM | POA: Diagnosis not present

## 2020-07-23 LAB — CBC WITH DIFFERENTIAL/PLATELET
Abs Immature Granulocytes: 0.06 10*3/uL (ref 0.00–0.07)
Basophils Absolute: 0 10*3/uL (ref 0.0–0.1)
Basophils Relative: 0 %
Eosinophils Absolute: 0.1 10*3/uL (ref 0.0–0.5)
Eosinophils Relative: 1 %
HCT: 38.8 % (ref 36.0–46.0)
Hemoglobin: 12.6 g/dL (ref 12.0–15.0)
Immature Granulocytes: 1 %
Lymphocytes Relative: 22 %
Lymphs Abs: 2.5 10*3/uL (ref 0.7–4.0)
MCH: 29.2 pg (ref 26.0–34.0)
MCHC: 32.5 g/dL (ref 30.0–36.0)
MCV: 89.8 fL (ref 80.0–100.0)
Monocytes Absolute: 0.7 10*3/uL (ref 0.1–1.0)
Monocytes Relative: 6 %
Neutro Abs: 8.3 10*3/uL — ABNORMAL HIGH (ref 1.7–7.7)
Neutrophils Relative %: 70 %
Platelets: 372 10*3/uL (ref 150–400)
RBC: 4.32 MIL/uL (ref 3.87–5.11)
RDW: 13.5 % (ref 11.5–15.5)
WBC: 11.7 10*3/uL — ABNORMAL HIGH (ref 4.0–10.5)
nRBC: 0 % (ref 0.0–0.2)

## 2020-07-23 LAB — COMPREHENSIVE METABOLIC PANEL
ALT: 18 U/L (ref 0–44)
AST: 19 U/L (ref 15–41)
Albumin: 4 g/dL (ref 3.5–5.0)
Alkaline Phosphatase: 66 U/L (ref 38–126)
Anion gap: 10 (ref 5–15)
BUN: 26 mg/dL — ABNORMAL HIGH (ref 8–23)
CO2: 27 mmol/L (ref 22–32)
Calcium: 8.7 mg/dL — ABNORMAL LOW (ref 8.9–10.3)
Chloride: 98 mmol/L (ref 98–111)
Creatinine, Ser: 0.91 mg/dL (ref 0.44–1.00)
GFR, Estimated: >60 mL/min (ref >60)
Glucose, Bld: 110 mg/dL — ABNORMAL HIGH (ref 70–99)
Potassium: 3.8 mmol/L (ref 3.5–5.1)
Sodium: 135 mmol/L (ref 135–145)
Total Bilirubin: 0.4 mg/dL (ref 0.3–1.2)
Total Protein: 8.3 g/dL — ABNORMAL HIGH (ref 6.5–8.1)

## 2020-07-23 LAB — URINALYSIS, ROUTINE W REFLEX MICROSCOPIC
Bacteria, UA: NONE SEEN
Bilirubin Urine: NEGATIVE
Glucose, UA: NEGATIVE mg/dL
Ketones, ur: NEGATIVE mg/dL
Leukocytes,Ua: NEGATIVE
Nitrite: NEGATIVE
Protein, ur: NEGATIVE mg/dL
Specific Gravity, Urine: 1.042 — ABNORMAL HIGH (ref 1.005–1.030)
pH: 5 (ref 5.0–8.0)

## 2020-07-23 LAB — LIPASE, BLOOD: Lipase: 31 U/L (ref 11–51)

## 2020-07-23 MED ORDER — ONDANSETRON HCL 4 MG/2ML IJ SOLN
4.0000 mg | Freq: Once | INTRAMUSCULAR | Status: AC
  Administered 2020-07-23: 4 mg via INTRAVENOUS
  Filled 2020-07-23: qty 2

## 2020-07-23 MED ORDER — IOHEXOL 300 MG/ML  SOLN
100.0000 mL | Freq: Once | INTRAMUSCULAR | Status: AC | PRN
  Administered 2020-07-23: 100 mL via INTRAVENOUS

## 2020-07-23 MED ORDER — HYDROCODONE-ACETAMINOPHEN 5-325 MG PO TABS: 1.0000 | ORAL_TABLET | Freq: Four times a day (QID) | ORAL | 0 refills | Status: DC | PRN

## 2020-07-23 MED ORDER — HYDROMORPHONE HCL 1 MG/ML IJ SOLN
0.5000 mg | Freq: Once | INTRAMUSCULAR | Status: AC
  Administered 2020-07-23: 0.5 mg via INTRAVENOUS
  Filled 2020-07-23: qty 1

## 2020-07-23 NOTE — ED Provider Notes (Signed)
[facility] EMERGENCY DEPARTMENT Provider Note   CSN: [id number] Arrival date & time: 07/23/20  3220     History Chief Complaint  Patient presents with   Abdominal Pain    Wendy Moran is a 70 y.o. female.  Patient complains of lower abdominal discomfort.  No fever no chills no vomiting  The history is provided by the patient and medical records. No language interpreter was used.  Abdominal Pain Pain location:  LLQ and RLQ Pain quality: aching   Pain radiates to:  Does not radiate Pain severity:  Moderate Onset quality:  Sudden Duration:  6 days Timing:  Constant Progression:  Waxing and waning Chronicity:  New Context: not alcohol use   Relieved by:  Nothing Worsened by:  Nothing Ineffective treatments:  None tried Associated symptoms: no chest pain, no cough, no diarrhea, no fatigue and no hematuria       Past Medical History:  Diagnosis Date   Atrial fibrillation ([place])    High cholesterol    Hypertension     Patient Active Problem List   Diagnosis Date Noted   Fatigue 02/08/2019   Diarrhea 02/08/2019   Abdominal pain 02/08/2019   Constipation 08/09/2018   Mitral regurgitation 07/27/2013   Atrial fibrillation ([place]) 07/21/2013   Hypertension     Past Surgical History:  Procedure Laterality Date   CARDIOVERSION N/A 07/22/2013   Procedure: CARDIOVERSION;  Surgeon: Fay Records, MD;  Location: [place];  Service: Cardiovascular;  Laterality: N/A;   COLONOSCOPY WITH PROPOFOL N/A 11/08/2018   Procedure: COLONOSCOPY WITH PROPOFOL;  Surgeon: Daneil Dolin, MD;  [facility];  Service: Endoscopy;  Laterality: N/A;  12:00pm   POLYPECTOMY  11/08/2018   Procedure: POLYPECTOMY;  Surgeon: Daneil Dolin, MD;  [facility];  Service: Endoscopy;;  colon   TEE WITHOUT CARDIOVERSION N/A 07/22/2013   Procedure: TRANSESOPHAGEAL ECHOCARDIOGRAM (TEE);  Surgeon: Fay Records, MD;  Location: [facility] ENDOSCOPY;  Service: Cardiovascular;  Laterality:  N/A;   TUBAL LIGATION       OB History   No obstetric history on file.     Family History  Problem Relation Age of Onset   Heart attack Father        x's 2   Colon cancer Neg Hx     Social History   Tobacco Use   Smoking status: Never   Smokeless tobacco: Never  Vaping Use   Vaping Use: Never used  Substance Use Topics   Alcohol use: Yes    Alcohol/week: 0.0 standard drinks    Comment: occas   Drug use: No    Home Medications Prior to Admission medications   Medication Sig Start Date End Date Taking? Authorizing Provider  HYDROcodone-acetaminophen (NORCO/VICODIN) 5-325 MG tablet Take 1 tablet by mouth every 6 (six) hours as needed. 07/23/20  Yes Milton Ferguson, MD  acetaminophen (TYLENOL) 500 MG tablet Take 1,000 mg by mouth every 8 (eight) hours as needed for moderate pain.    [provider]  diltiazem (CARDIZEM CD) 300 MG 24 hr capsule Take 1 capsule (300 mg total) by mouth daily. 12/16/19   Arnoldo Lenis, MD  olmesartan-hydrochlorothiazide (BENICAR HCT) 40-25 MG tablet Take 1 tablet by mouth daily.  05/14/18   [provider]  pantoprazole (PROTONIX) 20 MG tablet Take 1 tablet (20 mg total) by mouth daily. 12/16/19   Strader, Fransisco Hertz, PA-C  Probiotic Product (PROBIOTIC DAILY PO) Take 2 tablets by mouth daily. Fiber One gummies  [provider]  rivaroxaban (XARELTO) 20 MG TABS tablet Take 1 tablet (20 mg total) by mouth daily with supper. 12/16/19   Arnoldo Lenis, MD  zolpidem (AMBIEN) 10 MG tablet Take 10 mg by mouth at bedtime as needed for sleep.    [provider]    Allergies    Patient has no known allergies.  Review of Systems   Review of Systems  Constitutional:  Negative for appetite change and fatigue.  HENT:  Negative for congestion, ear discharge and sinus pressure.   Eyes:  Negative for discharge.  Respiratory:  Negative for cough.   Cardiovascular:  Negative for chest pain.  Gastrointestinal:   Positive for abdominal pain. Negative for diarrhea.  Genitourinary:  Negative for frequency and hematuria.  Musculoskeletal:  Negative for back pain.  Skin:  Negative for rash.  Neurological:  Negative for seizures and headaches.  Psychiatric/Behavioral:  Negative for hallucinations.    Physical Exam Updated Vital Signs BP (!) 157/78   Pulse 89   Temp 97.7 F (36.5 C)   Resp 18   Ht 5\' 7"  (1.702 m)   Wt 125 kg   SpO2 97%   BMI 43.16 kg/m   Physical Exam Vitals and nursing note reviewed.  Constitutional:      Appearance: She is well-developed.  HENT:     Head: Normocephalic.     Mouth/Throat:     Mouth: Mucous membranes are moist.  Eyes:     General: No scleral icterus.    Conjunctiva/sclera: Conjunctivae normal.  Neck:     Thyroid: No thyromegaly.  Cardiovascular:     Rate and Rhythm: Normal rate and regular rhythm.     Heart sounds: No murmur heard.   No friction rub. No gallop.  Pulmonary:     Breath sounds: No stridor. No wheezing or rales.  Chest:     Chest wall: No tenderness.  Abdominal:     General: There is no distension.     Tenderness: There is no abdominal tenderness. There is no rebound.     Comments: Mild tenderness throughout lower abdomen  Musculoskeletal:        General: Normal range of motion.     Cervical back: Neck supple.  Lymphadenopathy:     Cervical: No cervical adenopathy.  Skin:    Findings: No erythema or rash.  Neurological:     Mental Status: She is alert and oriented to person, place, and time.     Motor: No abnormal muscle tone.     Coordination: Coordination normal.  Psychiatric:        Behavior: Behavior normal.    ED Results / Procedures / Treatments   Labs (all labs ordered are listed, but only abnormal results are displayed) Labs Reviewed  CBC WITH DIFFERENTIAL/PLATELET - Abnormal; Notable for the following components:      Result Value   WBC 11.7 (*)    Neutro Abs 8.3 (*)    All other components within normal  limits  COMPREHENSIVE METABOLIC PANEL - Abnormal; Notable for the following components:   Glucose, Bld 110 (*)    BUN 26 (*)    Calcium 8.7 (*)    Total Protein 8.3 (*)    All other components within normal limits  URINALYSIS, ROUTINE W REFLEX MICROSCOPIC - Abnormal; Notable for the following components:   APPearance HAZY (*)    Specific Gravity, Urine 1.042 (*)    Hgb urine dipstick SMALL (*)    All other components  within normal limits  LIPASE, BLOOD    EKG None  Radiology CT ABDOMEN PELVIS W CONTRAST  Result Date: 07/23/2020 CLINICAL DATA:  Right and left lower quadrant pain. Possible appendicitis. EXAM: CT ABDOMEN AND PELVIS WITH CONTRAST TECHNIQUE: Multidetector CT imaging of the abdomen and pelvis was performed using the standard protocol following bolus administration of intravenous contrast. CONTRAST:  120mL OMNIPAQUE IOHEXOL 300 MG/ML  SOLN COMPARISON:  None. FINDINGS: Lower chest: 3 mm right lower lobe pulmonary nodule identified on image 7/series 4. Hepatobiliary: No suspicious focal abnormality within the liver parenchyma. Gallbladder is distended but otherwise unremarkable. No intrahepatic or extrahepatic biliary dilation. Pancreas: No focal mass lesion. No dilatation of the main duct. No intraparenchymal cyst. No peripancreatic edema. Spleen: No splenomegaly. No focal mass lesion. Adrenals/Urinary Tract: No adrenal nodule or mass. Kidneys unremarkable. No evidence for hydroureter. The urinary bladder appears normal for the degree of distention. Stomach/Bowel: Stomach is unremarkable. No gastric wall thickening. No evidence of outlet obstruction. Duodenum is normally positioned as is the ligament of Treitz. No small bowel wall thickening. No small bowel dilatation. The terminal ileum is normal. The appendix is normal. No gross colonic mass. No colonic wall thickening. Diverticular changes are noted in the left colon without evidence of diverticulitis. Vascular/Lymphatic: There is  mild atherosclerotic calcification of the abdominal aorta without aneurysm. There is no gastrohepatic or hepatoduodenal ligament lymphadenopathy. No retroperitoneal or mesenteric lymphadenopathy. No pelvic sidewall lymphadenopathy. Reproductive: The endometrium in the fundus of the uterus is thickened up to 17 mm. 7.2 x 5.9 x 5.3 cm low-density lesion identified in the region of the upper vagina/lower cervix. This is probably related to the cervix and could represent a collection of complex fluid or blood products in the cervix although cervical mass lesion is also certainly a concern. Less likely, but not excluded, low-density soft tissue or cystic mass of the upper vagina is a consideration. Other: Trace free fluid noted in the pelvis. Musculoskeletal: No worrisome lytic or sclerotic osseous abnormality. IMPRESSION: 1. 7.2 x 5.9 x 5.3 cm low-density lesion in the region of the upper vagina/lower cervix. This is probably related to the cervix and could represent a collection of complex fluid or blood products in the cervix although cervical mass lesion is also a concern. Less likely, but not excluded, low-density soft tissue or cystic mass of the upper vagina is a consideration. Pelvic ultrasound may prove helpful to further evaluate. 2. The endometrium in the fundus of the uterus is thickened up to 17 mm. This is abnormal in a postmenopausal female could also be further evaluated at the time of ultrasound. 3. 3 mm right lower lobe pulmonary nodule. No follow-up needed if patient is low-risk. Non-contrast chest CT can be considered in 12 months if patient is high-risk. This recommendation follows the consensus statement: Guidelines for Management of Incidental Pulmonary Nodules Detected on CT Images: From the Fleischner Society 2017; Radiology 2017; 284:228-243. 4. Trace free fluid in the pelvis. 5. Aortic Atherosclerosis (ICD10-I70.0). Electronically Signed   By: Misty Stanley M.D.   On: 07/23/2020 11:48   US  PELVIC COMPLETE WITH TRANSVAGINAL  Result Date: 07/23/2020 CLINICAL DATA:  Lower abdominal pain. Abnormal appearance of the uterus and cervix on CT EXAM: TRANSABDOMINAL AND TRANSVAGINAL ULTRASOUND OF PELVIS TECHNIQUE: Both transabdominal and transvaginal ultrasound examinations of the pelvis were performed. Transabdominal technique was performed for global imaging of the pelvis including uterus, ovaries, adnexal regions, and pelvic cul-de-sac. It was necessary to proceed with endovaginal exam following the  transabdominal exam to visualize the endometrium. COMPARISON:  Same day CT FINDINGS: Uterus Measurements: 14.4 x 5.4 x 5.1 cm = volume: 224 mL. Large collection of complex, mobile low level echoes within the region of the cervix measuring approximately 8.3 x 5.3 x 5.8 cm. No internal vascularity within the collection. Irregular appearance of the lower uterine segment and cervix, not well evaluated given proximity of the large complex fluid collection. Endometrium Thickness: 16 mm. Right ovary Not visualized. Left ovary Not visualized. Other findings No abnormal free fluid. IMPRESSION: 1. Abnormal appearance of the cervix and lower uterine segment. Large complex collection in the region of the cervix measuring 8.3 x 5.3 x 5.8 cm with mobile internal echoes suggesting blood products. Cervical malignancy is suspected. Gynecologic evaluation recommended. If further imaging is warranted clinically, consider contrast enhanced MRI of the pelvis. 2. Abnormally thickened endometrium measuring 16 mm. This can also be further evaluated by MRI. Electronically Signed   By: Davina Poke D.O.   On: 07/23/2020 14:17    Procedures Procedures   Medications Ordered in ED Medications  HYDROmorphone (DILAUDID) injection 0.5 mg (0.5 mg Intravenous Given 07/23/20 1015)  ondansetron (ZOFRAN) injection 4 mg (4 mg Intravenous Given 07/23/20 1015)  iohexol (OMNIPAQUE) 300 MG/ML solution 100 mL (100 mLs Intravenous Contrast  Given 07/23/20 1113)    ED Course  I have reviewed the triage vital signs and the nursing notes.  Pertinent labs & imaging results that were available during my care of the patient were reviewed by me and considered in my medical decision making (see chart for details).    MDM Rules/Calculators/A&P                          Patient with pelvic pain and possible pelvic mass.  She is given pain medicine and will be seen by GYN this week Final Clinical Impression(s) / ED Diagnoses Final diagnoses:  Pelvic pain    Rx / DC Orders ED Discharge Orders          Ordered    HYDROcodone-acetaminophen (NORCO/VICODIN) 5-325 MG tablet  Every 6 hours PRN        07/23/20 1427             Milton Ferguson, MD 07/26/20 1017

## 2020-07-23 NOTE — ED Notes (Signed)
Patient up ad lib and ambulatory to the bathroom

## 2020-07-23 NOTE — Discharge Instructions (Addendum)
Follow-up with Dr.Eure or one of his associates this week.  Call to make an appointment.  Tell them that you were in the emergency department and the physician wanted you to be seen this week.

## 2020-07-23 NOTE — ED Triage Notes (Signed)
Patient sent from PCP due to right and left lower quadrant pain and possible appendicitis. Patient states 5/10 abdominal pain and was given shot of Toradol at PCP. Patient states intermittent constipation and hurting over 2 weeks.

## 2020-07-26 ENCOUNTER — Other Ambulatory Visit: Payer: Self-pay

## 2020-07-26 ENCOUNTER — Encounter: Payer: Self-pay | Admitting: Obstetrics & Gynecology

## 2020-07-26 ENCOUNTER — Other Ambulatory Visit (HOSPITAL_COMMUNITY)
Admission: RE | Admit: 2020-07-26 | Discharge: 2020-07-26 | Disposition: A | Discharge status: Home / Self Care | Payer: BC Managed Care – PPO | Source: Ambulatory Visit | Attending: Obstetrics & Gynecology | Admitting: Obstetrics & Gynecology

## 2020-07-26 ENCOUNTER — Ambulatory Visit (INDEPENDENT_AMBULATORY_CARE_PROVIDER_SITE_OTHER): Payer: BC Managed Care – PPO | Admitting: Obstetrics & Gynecology

## 2020-07-26 VITALS — BP 140/80 | HR 85 | Ht 67.0 in | Wt 286.0 lb

## 2020-07-26 DIAGNOSIS — N857 Hematometra: Secondary | ICD-10-CM

## 2020-07-26 DIAGNOSIS — N72 Inflammatory disease of cervix uteri: Secondary | ICD-10-CM | POA: Diagnosis not present

## 2020-07-26 DIAGNOSIS — N950 Postmenopausal bleeding: Secondary | ICD-10-CM

## 2020-07-26 DIAGNOSIS — R9389 Abnormal findings on diagnostic imaging of other specified body structures: Secondary | ICD-10-CM

## 2020-07-26 NOTE — Progress Notes (Signed)
Chief Complaint  Patient presents with   Post Menopausal Bleeding    Seen in ED, has thickened endometrium 16 mm      70 y.o. G0P0000 No LMP recorded. Patient is postmenopausal. The current method of family planning is post menopausal status.  Outpatient Encounter Medications as of 07/26/2020  Medication Sig   acetaminophen (TYLENOL) 500 MG tablet Take 1,000 mg by mouth every 8 (eight) hours as needed for moderate pain.   diltiazem (CARDIZEM CD) 300 MG 24 hr capsule Take 1 capsule (300 mg total) by mouth daily.   HYDROcodone-acetaminophen (NORCO/VICODIN) 5-325 MG tablet Take 1 tablet by mouth every 6 (six) hours as needed.   olmesartan-hydrochlorothiazide (BENICAR HCT) 40-25 MG tablet Take 1 tablet by mouth daily.    pantoprazole (PROTONIX) 20 MG tablet Take 1 tablet (20 mg total) by mouth daily.   Probiotic Product (PROBIOTIC DAILY PO) Take 2 tablets by mouth daily. Fiber One gummies   rivaroxaban (XARELTO) 20 MG TABS tablet Take 1 tablet (20 mg total) by mouth daily with supper.   zolpidem (AMBIEN) 10 MG tablet Take 10 mg by mouth at bedtime as needed for sleep.   No facility-administered encounter medications on file as of 07/26/2020.    Subjective Pt referred from ED with lower pain with findings on CT/sonogram complex pelvic mass  I viewed it and it appears to be a hemtometra She is on xarelto for A fib No other recent vaginal bleeding  Past Medical History:  Diagnosis Date   Atrial fibrillation ([place])    High cholesterol    Hypertension     Past Surgical History:  Procedure Laterality Date   CARDIOVERSION N/A 07/22/2013   Procedure: CARDIOVERSION;  Surgeon: Fay Records, MD;  Location: [place];  Service: Cardiovascular;  Laterality: N/A;   COLONOSCOPY WITH PROPOFOL N/A 11/08/2018   Procedure: COLONOSCOPY WITH PROPOFOL;  Surgeon: Daneil Dolin, MD;  [facility];  Service: Endoscopy;  Laterality: N/A;  12:00pm   POLYPECTOMY  11/08/2018    Procedure: POLYPECTOMY;  Surgeon: Daneil Dolin, MD;  [facility];  Service: Endoscopy;;  colon   TEE WITHOUT CARDIOVERSION N/A 07/22/2013   Procedure: TRANSESOPHAGEAL ECHOCARDIOGRAM (TEE);  Surgeon: Fay Records, MD;  Location: [place];  Service: Cardiovascular;  Laterality: N/A;   TUBAL LIGATION      OB History     Gravida  0   Para  0   Term  0   Preterm  0   AB  0   Living  0      SAB  0   IAB  0   Ectopic  0   Multiple  0   Live Births  0           No Known Allergies  Social History   Socioeconomic History   Marital status: Divorced    Spouse name: Not on file   Number of children: Not on file   Years of education: Not on file   Highest education level: Not on file  Occupational History   Not on file  Tobacco Use   Smoking status: Never   Smokeless tobacco: Never  Vaping Use   Vaping Use: Never used  Substance and Sexual Activity   Alcohol use: Yes    Alcohol/week: 0.0 standard drinks    Comment: occas   Drug use: No   Sexual activity: Not Currently    Birth control/protection: None, Post-menopausal  Other Topics  Concern   Not on file  Social History Narrative   Not on file   Social Determinants of Health   Financial Resource Strain: Low Risk    Difficulty of Paying Living Expenses: Not hard at all  Food Insecurity: No Food Insecurity   Worried About Charity fundraiser in the Last Year: Never true   [place] in the Last Year: Never true  Transportation Needs: No Transportation Needs   Lack of Transportation (Medical): No   Lack of Transportation (Non-Medical): No  Physical Activity: Inactive   Days of Exercise per Week: 0 days   Minutes of Exercise per Session: 0 min  Stress: Stress Concern Present   Feeling of Stress : Rather much  Social Connections: Socially Isolated   Frequency of Communication with Friends and Family: Once a week   Frequency of Social Gatherings with Friends and Family: Once a week    Attends Religious Services: 1 to 4 times per year   Active Member of [employer] or Organizations: No   Attends Music therapist: Never   Marital Status: Divorced    Family History  Problem Relation Age of Onset   Heart attack Father        x's 2   Colon cancer Neg Hx     Medications:       Current Outpatient Medications:    acetaminophen (TYLENOL) 500 MG tablet, Take 1,000 mg by mouth every 8 (eight) hours as needed for moderate pain., Disp: , Rfl:    diltiazem (CARDIZEM CD) 300 MG 24 hr capsule, Take 1 capsule (300 mg total) by mouth daily., Disp: 90 capsule, Rfl: 3   HYDROcodone-acetaminophen (NORCO/VICODIN) 5-325 MG tablet, Take 1 tablet by mouth every 6 (six) hours as needed., Disp: 20 tablet, Rfl: 0   olmesartan-hydrochlorothiazide (BENICAR HCT) 40-25 MG tablet, Take 1 tablet by mouth daily. , Disp: , Rfl:    pantoprazole (PROTONIX) 20 MG tablet, Take 1 tablet (20 mg total) by mouth daily., Disp: 90 tablet, Rfl: 3   Probiotic Product (PROBIOTIC DAILY PO), Take 2 tablets by mouth daily. Fiber One gummies, Disp: , Rfl:    rivaroxaban (XARELTO) 20 MG TABS tablet, Take 1 tablet (20 mg total) by mouth daily with supper., Disp: 90 tablet, Rfl: 3   zolpidem (AMBIEN) 10 MG tablet, Take 10 mg by mouth at bedtime as needed for sleep., Disp: , Rfl:   Objective Blood pressure 140/80, pulse 85, height 5\' 7"  (1.702 m), weight 286 lb (129.7 kg).  I dilated the cervix aggressively and evacuated the uterus Cervix is mottled I think secondary to hemtometra I do not think primary cervical issue 4 biopsies taken  After cervix dilated 200+cc of blood clot old blood evacuated EMBx performed 3 mm pipelle without difficulty and pathology sent, probably non diagnostic blood  Pt felt much better after the blood is out  Pertinent ROS No burning with urination, frequency or urgency No nausea, vomiting or diarrhea Nor fever chills or other constitutional symptoms   Labs or  studies Reviewed labs and scans from the ED visit    Impression Diagnoses this Encounter::   ICD-10-CM   1. Post-menopausal bleeding  N95.0 Surgical pathology( [place]/ [facility])    Surgical pathology( [place]/ [place])    2. Thickened endometrium  R93.89 Surgical pathology( [place]/ [facility])    Surgical pathology( [place]/ [facility])      Established relevant diagnosis(es):   Plan/Recommendations: No orders of the defined types were  placed in this encounter.   Labs or Scans Ordered: No orders of the defined types were placed in this encounter.   Management:: See below  Follow up Will call with results and appropriate folow up which I suspect will be a hysteroscopy uterine curettage      All questions were answered.

## 2020-07-30 LAB — SURGICAL PATHOLOGY

## 2020-11-05 ENCOUNTER — Other Ambulatory Visit (HOSPITAL_COMMUNITY): Payer: Self-pay | Admitting: Family Medicine

## 2020-11-05 DIAGNOSIS — Z1231 Encounter for screening mammogram for malignant neoplasm of breast: Secondary | ICD-10-CM

## 2020-12-02 ENCOUNTER — Other Ambulatory Visit: Payer: Self-pay | Admitting: Student

## 2020-12-02 ENCOUNTER — Other Ambulatory Visit: Payer: Self-pay | Admitting: Cardiology

## 2020-12-10 NOTE — Progress Notes (Signed)
Cardiology Office Note    Date:  12/11/2020   ID:  Wendy Moran, DOB April 13, 1950, MRN [id number]  PCP:  Sharilyn Sites, MD  Cardiologist: Carlyle Dolly, MD    Chief Complaint  Patient presents with   Follow-up    Annual Visit     History of Present Illness:    Wendy Moran is a 70 y.o. female  with past medical history of paorxysmal atrial fibrillation (s/p DCCV in 2015 with repeat monitor in 08/2018 showing SVT, PAC's and PVC's with no recurrent atrial fibrillation), mitral regurgitation and HTN who presents to the office today for annual follow-up.   She was last examined by myself in 12/2019 and reported occasional sternal discomfort which would resolve with belching and denied any anginal symptoms. She was started on Protonix 20mg  daily and was encouraged to make [place] aware if symptoms persisted.   In talking the patient today, she reports overall feeling well from a cardiac perspective since her last office visit. She denies any recent chest pain or dyspnea on exertion with routine activities. Does report some dyspnea with walking up 20+ stairs at her work which has overall been stable. No recent orthopnea, PND or lower extremity edema. She does report occasional palpitations but no persistent symptoms. She did have an episode of vaginal bleeding in 07/2020 and was evaluated by Gynecology at that time with an old blood clot evacuated. She denies any recurrent bleeding since.   Past Medical History:  Diagnosis Date   Atrial fibrillation ([place])    High cholesterol    Hypertension     Past Surgical History:  Procedure Laterality Date   CARDIOVERSION N/A 07/22/2013   Procedure: CARDIOVERSION;  Surgeon: Fay Records, MD;  Location: [place];  Service: Cardiovascular;  Laterality: N/A;   COLONOSCOPY WITH PROPOFOL N/A 11/08/2018   Procedure: COLONOSCOPY WITH PROPOFOL;  Surgeon: Daneil Dolin, MD;  [facility];  Service: Endoscopy;  Laterality: N/A;   12:00pm   POLYPECTOMY  11/08/2018   Procedure: POLYPECTOMY;  Surgeon: Daneil Dolin, MD;  [facility];  Service: Endoscopy;;  colon   TEE WITHOUT CARDIOVERSION N/A 07/22/2013   Procedure: TRANSESOPHAGEAL ECHOCARDIOGRAM (TEE);  Surgeon: Fay Records, MD;  Location: [facility] ENDOSCOPY;  Service: Cardiovascular;  Laterality: N/A;   TUBAL LIGATION      Current Medications: Outpatient Medications Prior to Visit  Medication Sig Dispense Refill   acetaminophen (TYLENOL) 500 MG tablet Take 1,000 mg by mouth every 8 (eight) hours as needed for moderate pain.     HYDROcodone-acetaminophen (NORCO/VICODIN) 5-325 MG tablet Take 1 tablet by mouth every 6 (six) hours as needed. 20 tablet 0   olmesartan-hydrochlorothiazide (BENICAR HCT) 40-25 MG tablet Take 1 tablet by mouth daily.      pantoprazole (PROTONIX) 20 MG tablet Take 1 tablet by mouth once daily 90 tablet 0   Probiotic Product (PROBIOTIC DAILY PO) Take 2 tablets by mouth daily. Fiber One gummies     zolpidem (AMBIEN) 10 MG tablet Take 10 mg by mouth at bedtime as needed for sleep.     diltiazem (CARDIZEM CD) 300 MG 24 hr capsule Take 1 capsule by mouth once daily 90 capsule 1   rivaroxaban (XARELTO) 20 MG TABS tablet Take 1 tablet (20 mg total) by mouth daily with supper. 90 tablet 3   No facility-administered medications prior to visit.     Allergies:   Patient has no known allergies.   Social History   Socioeconomic  History   Marital status: Divorced    Spouse name: Not on file   Number of children: Not on file   Years of education: Not on file   Highest education level: Not on file  Occupational History   Not on file  Tobacco Use   Smoking status: Never   Smokeless tobacco: Never  Vaping Use   Vaping Use: Never used  Substance and Sexual Activity   Alcohol use: Yes    Alcohol/week: 0.0 standard drinks    Comment: occas   Drug use: No   Sexual activity: Not Currently    Birth control/protection: None, Post-menopausal   Other Topics Concern   Not on file  Social History Narrative   Not on file   Social Determinants of Health   Financial Resource Strain: Low Risk    Difficulty of Paying Living Expenses: Not hard at all  Food Insecurity: No Food Insecurity   Worried About Charity fundraiser in the Last Year: Never true   [place] in the Last Year: Never true  Transportation Needs: No Transportation Needs   Lack of Transportation (Medical): No   Lack of Transportation (Non-Medical): No  Physical Activity: Inactive   Days of Exercise per Week: 0 days   Minutes of Exercise per Session: 0 min  Stress: Stress Concern Present   Feeling of Stress : Rather much  Social Connections: Socially Isolated   Frequency of Communication with Friends and Family: Once a week   Frequency of Social Gatherings with Friends and Family: Once a week   Attends Religious Services: 1 to 4 times per year   Active Member of [employer] or Organizations: No   Attends Archivist Meetings: Never   Marital Status: Divorced     Family History:  The patient's family history includes Heart attack in her father.   Review of Systems:    Please see the history of present illness.     All other systems reviewed and are otherwise negative except as noted above.   Physical Exam:    VS:  BP 128/62   Pulse 88   Ht 5\' 7"  (1.702 m)   Wt 273 lb (123.8 kg)   SpO2 97%   BMI 42.76 kg/m    General: Well developed, well nourished,female appearing in no acute distress. Head: Normocephalic, atraumatic. Neck: No carotid bruits. JVD not elevated.  Lungs: Respirations regular and unlabored, without wheezes or rales.  Heart: Regular rate and rhythm. No S3 or S4.  No murmur, no rubs, or gallops appreciated. Abdomen: Appears non-distended. No obvious abdominal masses. Msk:  Strength and tone appear normal for age. No obvious joint deformities or effusions. Extremities: No clubbing or cyanosis. No pitting edema.  Distal pedal  pulses are 2+ bilaterally. Neuro: Alert and oriented X 3. Moves all extremities spontaneously. No focal deficits noted. Psych:  Responds to questions appropriately with a normal affect. Skin: No rashes or lesions noted  Wt Readings from Last 3 Encounters:  12/11/20 273 lb (123.8 kg)  07/26/20 286 lb (129.7 kg)  07/23/20 275 lb 9.2 oz (125 kg)     Studies/Labs Reviewed:   EKG:  EKG is ordered today.  The ekg ordered today demonstrates normal sinus rhythm, heart rate 88 with isolated PVC.  No acute ST changes.  Recent Labs: 07/23/2020: ALT 18; BUN 26; Creatinine, Ser 0.91; Hemoglobin 12.6; Platelets 372; Potassium 3.8; Sodium 135   Lipid Panel No results found for: CHOL, TRIG, HDL, CHOLHDL, VLDL,  [place], LDLDIRECT  Additional studies/ records that were reviewed today include:   NST: 05/2015 There was no ST segment deviation noted during stress. The study is normal. No myocardial ischemia or scar. This is a low risk study. Nuclear stress EF: 52%.   Event Monitor: 08/2018 12 day holter monitor report Min HR 55, Max HR 161, Avg HR 85 Occasional supraventricular ectopy in the form of isolated PACs, coupltes. Multiple runs of SVT longest 22 beats, probable atach. Occasional ventricular ectopy (2.2% burden) in the form of isolated PVCs, bigeminy, trigeminy, couplets. 6 short runs of NSVT longest 7 beats. Reported symptoms corresponded with supraventricular and ventricular ectopy as reported above.    Assessment:    1. Paroxysmal atrial fibrillation (HCC)   2. Nonrheumatic mitral valve regurgitation   3. Essential hypertension      Plan:   In order of problems listed above:  1. Paroxysmal Atrial Fibrillation - Initially diagnosed in 2015 with successful DCCV and her monitor in 08/2018 showed SVT, PAC's and PVCs with no recurrent atrial fibrillation. She does report occasional palpitations but no persistent symptoms. Continue Cardizem CD 300 mg daily. - She did have an  episode of vaginal bleeding earlier this year but no recurrence. She remains on Xarelto 20 mg daily.  Will request a copy of most recent labs from her PCP to ensure she is on the correct dose. Creatinine was stable at 0.91 in 07/2020.  2. Mitral Regurgitation - She had trivial MR by echocardiogram in 12/2014. She denies any recent symptoms and she does not have a murmur on examination today. Can consider repeat imaging in the future if clinically indicated at that time.  3. HTN - Her blood pressure is well-controlled at 128/62 during today's visit. Continue current medication regimen with Cardizem CD 300 mg daily and Olmesartan-HCTZ 40-25 mg daily.    Medication Adjustments/Labs and Tests Ordered: Current medicines are reviewed at length with the patient today.  Concerns regarding medicines are outlined above.  Medication changes, Labs and Tests ordered today are listed in the Patient Instructions below. Patient Instructions  Medication Instructions:  Your physician recommends that you continue on your current medications as directed. Please refer to the Current Medication list given to you today.   Labwork: None today   Testing/Procedures: None today   Follow-Up: 1 year   Any Other Special Instructions Will Be Listed Below (If Applicable).  If you need a refill on your cardiac medications before your next appointment, please call your pharmacy.   Signed, Erma Heritage, PA-C  12/11/2020 4:26 PM    [address] [place],  [address] Phone: [phone] Fax: [phone]

## 2020-12-11 ENCOUNTER — Encounter: Payer: Self-pay | Admitting: Student

## 2020-12-11 ENCOUNTER — Other Ambulatory Visit: Payer: Self-pay

## 2020-12-11 ENCOUNTER — Ambulatory Visit (INDEPENDENT_AMBULATORY_CARE_PROVIDER_SITE_OTHER): Payer: BC Managed Care – PPO | Admitting: Student

## 2020-12-11 VITALS — BP 128/62 | HR 88 | Ht 67.0 in | Wt 273.0 lb

## 2020-12-11 DIAGNOSIS — I340 Nonrheumatic mitral (valve) insufficiency: Secondary | ICD-10-CM | POA: Diagnosis not present

## 2020-12-11 DIAGNOSIS — I480 Paroxysmal atrial fibrillation: Secondary | ICD-10-CM

## 2020-12-11 DIAGNOSIS — I10 Essential (primary) hypertension: Secondary | ICD-10-CM | POA: Diagnosis not present

## 2020-12-11 MED ORDER — DILTIAZEM HCL ER COATED BEADS 300 MG PO CP24: 300.0000 mg | ORAL_CAPSULE | Freq: Every day | ORAL | 3 refills | Status: DC

## 2020-12-11 MED ORDER — RIVAROXABAN 20 MG PO TABS: 20.0000 mg | ORAL_TABLET | Freq: Every day | ORAL | 3 refills | Status: AC

## 2020-12-11 NOTE — Patient Instructions (Signed)
Medication Instructions:  Your physician recommends that you continue on your current medications as directed. Please refer to the Current Medication list given to you today.   Labwork: None today  Testing/Procedures: None today  Follow-Up: 1 year  Any Other Special Instructions Will Be Listed Below (If Applicable).  If you need a refill on your cardiac medications before your next appointment, please call your pharmacy.  

## 2020-12-12 DIAGNOSIS — I4891 Unspecified atrial fibrillation: Secondary | ICD-10-CM | POA: Diagnosis not present

## 2020-12-12 DIAGNOSIS — R7309 Other abnormal glucose: Secondary | ICD-10-CM | POA: Diagnosis not present

## 2020-12-12 DIAGNOSIS — E039 Hypothyroidism, unspecified: Secondary | ICD-10-CM | POA: Diagnosis not present

## 2020-12-12 DIAGNOSIS — Z6839 Body mass index (BMI) 39.0-39.9, adult: Secondary | ICD-10-CM | POA: Diagnosis not present

## 2020-12-12 DIAGNOSIS — Z23 Encounter for immunization: Secondary | ICD-10-CM | POA: Diagnosis not present

## 2020-12-12 DIAGNOSIS — E7849 Other hyperlipidemia: Secondary | ICD-10-CM | POA: Diagnosis not present

## 2020-12-12 DIAGNOSIS — I10 Essential (primary) hypertension: Secondary | ICD-10-CM | POA: Diagnosis not present

## 2020-12-12 DIAGNOSIS — E782 Mixed hyperlipidemia: Secondary | ICD-10-CM | POA: Diagnosis not present

## 2020-12-17 ENCOUNTER — Other Ambulatory Visit: Payer: Self-pay

## 2020-12-17 ENCOUNTER — Ambulatory Visit (HOSPITAL_COMMUNITY)
Admission: RE | Admit: 2020-12-17 | Discharge: 2020-12-17 | Disposition: A | Discharge status: Home / Self Care | Payer: BC Managed Care – PPO | Source: Ambulatory Visit | Attending: Family Medicine | Admitting: Family Medicine

## 2020-12-17 DIAGNOSIS — Z1231 Encounter for screening mammogram for malignant neoplasm of breast: Secondary | ICD-10-CM

## 2020-12-19 ENCOUNTER — Other Ambulatory Visit (HOSPITAL_COMMUNITY): Payer: Self-pay | Admitting: Family Medicine

## 2020-12-19 DIAGNOSIS — R928 Other abnormal and inconclusive findings on diagnostic imaging of breast: Secondary | ICD-10-CM

## 2021-01-15 ENCOUNTER — Ambulatory Visit (HOSPITAL_COMMUNITY)
Admission: RE | Admit: 2021-01-15 | Discharge: 2021-01-15 | Disposition: A | Discharge status: Home / Self Care | Payer: BC Managed Care – PPO | Source: Ambulatory Visit | Attending: Family Medicine | Admitting: Family Medicine

## 2021-01-15 ENCOUNTER — Encounter (HOSPITAL_COMMUNITY): Payer: Self-pay

## 2021-01-15 ENCOUNTER — Other Ambulatory Visit: Payer: Self-pay

## 2021-01-15 DIAGNOSIS — R928 Other abnormal and inconclusive findings on diagnostic imaging of breast: Secondary | ICD-10-CM

## 2021-01-15 DIAGNOSIS — R922 Inconclusive mammogram: Secondary | ICD-10-CM | POA: Diagnosis not present

## 2021-03-02 ENCOUNTER — Other Ambulatory Visit: Payer: Self-pay | Admitting: Cardiology

## 2021-10-02 DIAGNOSIS — I4891 Unspecified atrial fibrillation: Secondary | ICD-10-CM | POA: Diagnosis not present

## 2021-10-02 DIAGNOSIS — E7849 Other hyperlipidemia: Secondary | ICD-10-CM | POA: Diagnosis not present

## 2021-10-02 DIAGNOSIS — E119 Type 2 diabetes mellitus without complications: Secondary | ICD-10-CM | POA: Diagnosis not present

## 2021-10-02 DIAGNOSIS — E782 Mixed hyperlipidemia: Secondary | ICD-10-CM | POA: Diagnosis not present

## 2021-10-02 DIAGNOSIS — E039 Hypothyroidism, unspecified: Secondary | ICD-10-CM | POA: Diagnosis not present

## 2021-10-02 DIAGNOSIS — Z23 Encounter for immunization: Secondary | ICD-10-CM | POA: Diagnosis not present

## 2021-10-02 DIAGNOSIS — Z6841 Body Mass Index (BMI) 40.0 and over, adult: Secondary | ICD-10-CM | POA: Diagnosis not present

## 2021-10-02 DIAGNOSIS — I10 Essential (primary) hypertension: Secondary | ICD-10-CM | POA: Diagnosis not present

## 2021-11-06 ENCOUNTER — Encounter: Payer: Self-pay | Admitting: *Deleted

## 2021-12-09 ENCOUNTER — Other Ambulatory Visit (HOSPITAL_COMMUNITY): Payer: Self-pay | Admitting: Family Medicine

## 2021-12-09 DIAGNOSIS — Z1231 Encounter for screening mammogram for malignant neoplasm of breast: Secondary | ICD-10-CM

## 2022-01-02 ENCOUNTER — Encounter: Payer: Self-pay | Admitting: Cardiology

## 2022-01-02 ENCOUNTER — Ambulatory Visit: Payer: BC Managed Care – PPO | Attending: Cardiology | Admitting: Cardiology

## 2022-01-02 VITALS — BP 136/74 | HR 88 | Ht 67.0 in | Wt 248.7 lb

## 2022-01-02 DIAGNOSIS — I340 Nonrheumatic mitral (valve) insufficiency: Secondary | ICD-10-CM

## 2022-01-02 DIAGNOSIS — I10 Essential (primary) hypertension: Secondary | ICD-10-CM

## 2022-01-02 DIAGNOSIS — R350 Frequency of micturition: Secondary | ICD-10-CM | POA: Diagnosis not present

## 2022-01-02 DIAGNOSIS — I480 Paroxysmal atrial fibrillation: Secondary | ICD-10-CM

## 2022-01-02 NOTE — Progress Notes (Signed)
Cardiology Office Note   Date:  01/02/2022   ID:  Wendy Moran, DOB [birth year], MRN [id number]  PCP:  Sharilyn Sites, MD  Cardiologist:  Dr. Zandra Abts    Chief Complaint  Patient presents with   PAF      History of Present Illness: Wendy Moran is a 70 y.o. female who presents for PAF  Past medical history of paorxysmal atrial fibrillation (s/p DCCV in 2015 with repeat monitor in 08/2018 showing SVT, PAC's and PVC's with no recurrent atrial fibrillation), mitral regurgitation and HTN who presents to the office today for annual follow-up.    In 12/2019 reported occasional sternal discomfort which would resolve with belching and denied any anginal symptoms. She was started on Protonix '20mg'$  daily and was encouraged to make [place] aware if symptoms persisted.   Last visit 12/11/20 with Maude Leriche, PA.   On xarelto and dilt.  Was stable last visit.  Trivial MR 2016.  No symptoms.  HTN stable.   Today no awareness of atrial fib, no chest pain, no bleeding (vaginal or otherwise)  no syncope.  Does have dyspnea with activity but mostly with climbing steep stairs.  Has to rest 10 sec then it is resolved.  Never wakes her from sleep.   She does have urinary issues, having to void freq.  Wakes several times per night to go.  She has not discussed with her PCP yet but plans to in near future.  She has stopped drinking fluids at night.  No foul smell with urine or blood.  No dysuria.  Possible overactive bladder.     Recent labs from PCP with A1c of 6, BUN 22, Cr 0.86   LDL 104 TSH 3.130  Past Medical History:  Diagnosis Date   Atrial fibrillation (HCC)    High cholesterol    Hypertension     Past Surgical History:  Procedure Laterality Date   CARDIOVERSION N/A 07/22/2013   Procedure: CARDIOVERSION;  Surgeon: Fay Records, MD;  Location: [place];  Service: Cardiovascular;  Laterality: N/A;   COLONOSCOPY WITH PROPOFOL N/A 11/08/2018   Procedure: COLONOSCOPY WITH PROPOFOL;   Surgeon: Daneil Dolin, MD;  [facility];  Service: Endoscopy;  Laterality: N/A;  12:00pm   POLYPECTOMY  11/08/2018   Procedure: POLYPECTOMY;  Surgeon: Daneil Dolin, MD;  [facility];  Service: Endoscopy;;  colon   TEE WITHOUT CARDIOVERSION N/A 07/22/2013   Procedure: TRANSESOPHAGEAL ECHOCARDIOGRAM (TEE);  Surgeon: Fay Records, MD;  Location: [facility] ENDOSCOPY;  Service: Cardiovascular;  Laterality: N/A;   TUBAL LIGATION       Current Outpatient Medications  Medication Sig Dispense Refill   acetaminophen (TYLENOL) 500 MG tablet Take 1,000 mg by mouth every 8 (eight) hours as needed for moderate pain.     diltiazem (CARDIZEM CD) 300 MG 24 hr capsule Take 1 capsule (300 mg total) by mouth daily. 90 capsule 3   olmesartan-hydrochlorothiazide (BENICAR HCT) 40-25 MG tablet Take 1 tablet by mouth daily.      pantoprazole (PROTONIX) 20 MG tablet Take 1 tablet by mouth once daily 90 tablet 2   Probiotic Product (PROBIOTIC DAILY PO) Take 2 tablets by mouth daily. Fiber One gummies     rivaroxaban (XARELTO) 20 MG TABS tablet Take 1 tablet (20 mg total) by mouth daily with supper. 90 tablet 3   zolpidem (AMBIEN) 10 MG tablet Take 10 mg by mouth at bedtime as needed for sleep.  No current facility-administered medications for this visit.    Allergies:   Patient has no known allergies.    Social History:  The patient  reports that she has never smoked. She has never used smokeless tobacco. She reports current alcohol use. She reports that she does not use drugs.   Family History:  The patient's family history includes Heart attack in her father.    ROS:  General:no colds or fevers, no weight changes Skin:no rashes or ulcers HEENT:no blurred vision, no congestion CV:see HPI PUL:see HPI GI:no diarrhea constipation or melena, no indigestion GU:no hematuria, no dysuria, freq urination MS:no joint pain, no claudication Neuro:no syncope, no lightheadedness Endo:no  diabetes, no thyroid disease  Wt Readings from Last 3 Encounters:  01/02/22 248 lb 11.2 oz (112.8 kg)  12/11/20 273 lb (123.8 kg)  07/26/20 286 lb (129.7 kg)     PHYSICAL EXAM: VS:  BP 136/74 (BP Location: Left Arm, Patient Position: Sitting, Cuff Size: Large)   Pulse 88   Ht '5\' 7"'$  (1.702 m)   Wt 248 lb 11.2 oz (112.8 kg)   BMI 38.95 kg/m  , BMI Body mass index is 38.95 kg/m. General:Pleasant affect, NAD Skin:Warm and dry, brisk capillary refill HEENT:normocephalic, sclera clear, mucus membranes moist Neck:supple, no JVD, no bruits  Heart:S1S2 RRR with 1-2 systolic murmur, no gallup, rub or click Lungs:clear without rales, rhonchi, or wheezes NTZ:GYFV, non tender, + BS, do not palpate liver spleen or masses Ext:no lower ext edema, 2+ pedal pulses, 2+ radial pulses Neuro:alert and oriented X 3, MAE, follows commands, + facial symmetry    EKG:  EKG is ordered today. The ekg ordered today demonstrates SR with borderline 1st degree block.  Rare PVC.  No acute ST changes from prior.     Recent Labs: No results found for requested labs within last 365 days.    Lipid Panel No results found for: "CHOL", "TRIG", "HDL", "CHOLHDL", "VLDL", "LDLCALC", "LDLDIRECT"     Other studies Reviewed: Additional studies/ records that were reviewed today include: . NST: 05/2015 There was no ST segment deviation noted during stress. The study is normal. No myocardial ischemia or scar. This is a low risk study. Nuclear stress EF: 52%.     Event Monitor: 08/2018 12 day holter monitor report Min HR 55, Max HR 161, Avg HR 85 Occasional supraventricular ectopy in the form of isolated PACs, coupltes. Multiple runs of SVT longest 22 beats, probable atach. Occasional ventricular ectopy (2.2% burden) in the form of isolated PVCs, bigeminy, trigeminy, couplets. 6 short runs of NSVT longest 7 beats. Reported symptoms corresponded with supraventricular and ventricular ectopy as reported  above.  ASSESSMENT AND PLAN:  1.  PAF no awareness of atrial fib.  Continues on xarelto and last Hgb in Sept was stable.  Recent labs from PCP reviewed.  In 2015 she underwent DCCV.   No bleeding on the xarelto EKG stable.   2.  Hx trivial MR on eco in 2016, soft murmur now, she is DOE but no change with her dyspnea.  We discussed repeat echo now vs for increased dyspnea.  She would prefer to wait.   3.  HTN stable today continue current meds  4.  Hx neg stress test in past.   No angina today  5.  Urinary freq, possible overactive bladder.  She will discuss with her PCP   Follow up in 1 year unless increased dyspnea.   Current medicines are reviewed with the patient today.  The patient  Has no concerns regarding medicines.  The following changes have been made:  See above Labs/ tests ordered today include:see above  Disposition:   FU:  see above  Signed, Cecilie Kicks, NP  01/02/2022 3:08 PM    [facility] [place], [place], [address] [place] [place], [place] Phone: [phone]; Fax: [phone]

## 2022-01-02 NOTE — Patient Instructions (Signed)
Medication Instructions:  Your physician recommends that you continue on your current medications as directed. Please refer to the Current Medication list given to you today.   Labwork: None  Testing/Procedures: None  Follow-Up: Follow up with [facility] in 1 year.   Any Other Special Instructions Will Be Listed Below (If Applicable).     If you need a refill on your cardiac medications before your next appointment, please call your pharmacy.

## 2022-01-20 ENCOUNTER — Ambulatory Visit (HOSPITAL_COMMUNITY)
Admission: RE | Admit: 2022-01-20 | Discharge: 2022-01-20 | Disposition: A | Discharge status: Home / Self Care | Payer: BC Managed Care – PPO | Source: Ambulatory Visit | Attending: Family Medicine | Admitting: Family Medicine

## 2022-01-20 DIAGNOSIS — Z1231 Encounter for screening mammogram for malignant neoplasm of breast: Secondary | ICD-10-CM | POA: Insufficient documentation

## 2022-05-08 DIAGNOSIS — H5203 Hypermetropia, bilateral: Secondary | ICD-10-CM | POA: Diagnosis not present

## 2022-05-30 DIAGNOSIS — E039 Hypothyroidism, unspecified: Secondary | ICD-10-CM | POA: Diagnosis not present

## 2022-05-30 DIAGNOSIS — I4891 Unspecified atrial fibrillation: Secondary | ICD-10-CM | POA: Diagnosis not present

## 2022-05-30 DIAGNOSIS — G4700 Insomnia, unspecified: Secondary | ICD-10-CM | POA: Diagnosis not present

## 2022-05-30 DIAGNOSIS — E782 Mixed hyperlipidemia: Secondary | ICD-10-CM | POA: Diagnosis not present

## 2022-05-30 DIAGNOSIS — I10 Essential (primary) hypertension: Secondary | ICD-10-CM | POA: Diagnosis not present

## 2022-05-30 DIAGNOSIS — Z6841 Body Mass Index (BMI) 40.0 and over, adult: Secondary | ICD-10-CM | POA: Diagnosis not present

## 2022-10-30 DIAGNOSIS — R7303 Prediabetes: Secondary | ICD-10-CM | POA: Diagnosis not present

## 2022-10-30 DIAGNOSIS — I10 Essential (primary) hypertension: Secondary | ICD-10-CM | POA: Diagnosis not present

## 2022-10-30 DIAGNOSIS — E119 Type 2 diabetes mellitus without complications: Secondary | ICD-10-CM | POA: Diagnosis not present

## 2022-10-30 DIAGNOSIS — E039 Hypothyroidism, unspecified: Secondary | ICD-10-CM | POA: Diagnosis not present

## 2022-10-30 DIAGNOSIS — Z23 Encounter for immunization: Secondary | ICD-10-CM | POA: Diagnosis not present

## 2022-10-30 DIAGNOSIS — I4891 Unspecified atrial fibrillation: Secondary | ICD-10-CM | POA: Diagnosis not present

## 2022-10-30 DIAGNOSIS — Z6841 Body Mass Index (BMI) 40.0 and over, adult: Secondary | ICD-10-CM | POA: Diagnosis not present

## 2022-10-30 DIAGNOSIS — Z0001 Encounter for general adult medical examination with abnormal findings: Secondary | ICD-10-CM | POA: Diagnosis not present

## 2022-10-30 DIAGNOSIS — E782 Mixed hyperlipidemia: Secondary | ICD-10-CM | POA: Diagnosis not present

## 2022-10-30 DIAGNOSIS — Z1331 Encounter for screening for depression: Secondary | ICD-10-CM | POA: Diagnosis not present

## 2023-01-19 ENCOUNTER — Other Ambulatory Visit (HOSPITAL_COMMUNITY): Payer: Self-pay | Admitting: Family Medicine

## 2023-01-19 DIAGNOSIS — Z1231 Encounter for screening mammogram for malignant neoplasm of breast: Secondary | ICD-10-CM

## 2023-01-26 ENCOUNTER — Ambulatory Visit (HOSPITAL_COMMUNITY)
Admission: RE | Admit: 2023-01-26 | Discharge: 2023-01-26 | Disposition: A | Discharge status: Home / Self Care | Payer: Medicare HMO | Source: Ambulatory Visit

## 2023-01-26 DIAGNOSIS — Z1231 Encounter for screening mammogram for malignant neoplasm of breast: Secondary | ICD-10-CM | POA: Diagnosis not present

## 2023-02-09 ENCOUNTER — Ambulatory Visit: Payer: Medicare HMO | Attending: Cardiology | Admitting: Cardiology

## 2023-02-09 ENCOUNTER — Encounter: Payer: Self-pay | Admitting: Cardiology

## 2023-02-09 VITALS — BP 140/70 | HR 94 | Ht 67.0 in | Wt 294.4 lb

## 2023-02-09 DIAGNOSIS — I10 Essential (primary) hypertension: Secondary | ICD-10-CM | POA: Diagnosis not present

## 2023-02-09 DIAGNOSIS — I480 Paroxysmal atrial fibrillation: Secondary | ICD-10-CM

## 2023-02-09 DIAGNOSIS — D6869 Other thrombophilia: Secondary | ICD-10-CM | POA: Diagnosis not present

## 2023-02-09 DIAGNOSIS — I340 Nonrheumatic mitral (valve) insufficiency: Secondary | ICD-10-CM | POA: Diagnosis not present

## 2023-02-09 MED ORDER — DILTIAZEM HCL ER COATED BEADS 360 MG PO CP24: 360.0000 mg | ORAL_CAPSULE | Freq: Every day | ORAL | 3 refills | Status: DC

## 2023-02-09 NOTE — Progress Notes (Signed)
Clinical Summary Wendy Moran is a 73 y.o.female seen today for follow up of the following medical problems.    1. Afib - s/p DCCV 07/2013 - on xarelto and diltiazem.   - she reports prior negative sleep study     08/2018 monitor PACs, PSVT, PVCs, short runs NSVT. No afib   -no recent palpitations - compliant with meds - no bleeding on xarelto   2. Mitral regurgitation - moderate by TEE 07/2013 - repeat TTE 12/2014 showed only mild MR   - denies any symptoms   3. HTN - compliant with meds     SH:    Cordella Register had to put to sleep few years ago, remains emotional about it. 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   Past Medical History:  Diagnosis Date   Atrial fibrillation (HCC)    High cholesterol    Hypertension      No Known Allergies   Current Outpatient Medications  Medication Sig Dispense Refill   acetaminophen (TYLENOL) 500 MG tablet Take 1,000 mg by mouth every 8 (eight) hours as needed for moderate pain.     diltiazem (CARDIZEM CD) 360 MG 24 hr capsule Take 1 capsule (360 mg total) by mouth daily. 90 capsule 3   olmesartan-hydrochlorothiazide (BENICAR HCT) 40-25 MG tablet Take 1 tablet by mouth daily.      pantoprazole (PROTONIX) 20 MG tablet Take 1 tablet by mouth once daily 90 tablet 2   Probiotic Product (PROBIOTIC DAILY PO) Take 2 tablets by mouth daily. Fiber One gummies     rivaroxaban (XARELTO) 20 MG TABS tablet Take 1 tablet (20 mg total) by mouth daily with supper. 90 tablet 3   zolpidem (AMBIEN) 10 MG tablet Take 10 mg by mouth at bedtime as needed for sleep.     No current facility-administered medications for this visit.     Past Surgical History:  Procedure Laterality Date   CARDIOVERSION N/A 07/22/2013   Procedure: CARDIOVERSION;  Surgeon: Pricilla Riffle, MD;  Location: [facility] ENDOSCOPY;  Service: Cardiovascular;  Laterality: N/A;   COLONOSCOPY WITH PROPOFOL N/A  11/08/2018   Procedure: COLONOSCOPY WITH PROPOFOL;  Surgeon: Corbin Ade, MD;  [facility];  Service: Endoscopy;  Laterality: N/A;  12:00pm   POLYPECTOMY  11/08/2018   Procedure: POLYPECTOMY;  Surgeon: Corbin Ade, MD;  [facility];  Service: Endoscopy;;  colon   TEE WITHOUT CARDIOVERSION N/A 07/22/2013   Procedure: TRANSESOPHAGEAL ECHOCARDIOGRAM (TEE);  Surgeon: Pricilla Riffle, MD;  Location: [facility] ENDOSCOPY;  Service: Cardiovascular;  Laterality: N/A;   TUBAL LIGATION       No Known Allergies    Family History  Problem Relation Age of Onset   Heart attack Father        x's 2   Colon cancer Neg Hx      Social History Ms. Gavina reports that she has never smoked. She has never used smokeless tobacco. Ms. Nipper reports current alcohol use.     Physical Examination Today's Vitals   02/09/23 1124 02/09/23 1130 02/09/23 1149  BP: (!) 150/74 (!) 156/82 (!) 140/70  Pulse: 94    SpO2: 97%    Weight: 294 lb 6.4 oz (133.5 kg)    Height: 5\' 7"  (1.702 m)     Body mass index is 46.11 kg/m.  Gen: resting comfortably, no acute distress HEENT: no scleral icterus,  pupils equal round and reactive, no palptable cervical adenopathy,  CV: RRR, 2/6 systolic murmur apex Resp: Clear to auscultation bilaterally GI: abdomen is soft, non-tender, non-distended, normal bowel sounds, no hepatosplenomegaly MSK: extremities are warm, no edema.  Skin: warm, no rash Neuro:  no focal deficits Psych: appropriate affect   Diagnostic Studies  07/2013 TEE Study Conclusions  - Left ventricle: No evidence of thrombus. - Left atrium: No evidence of thrombus in the atrial cavity or appendage. No evidence of thrombus in the atrial cavity or appendage.  Impressions:  - Successful cardioversion. No cardiac source of emboli was indentified.  Left ventricle: LVEF is normal. No evidence of  thrombus.  ------------------------------------------------------------------- Aortic valve: AV is normal. No AI.  ------------------------------------------------------------------- Mitral valve: MV is normal. There is moderate MR.  ------------------------------------------------------------------- Left atrium: No evidence of thrombus in the atrial cavity or appendage. No evidence of thrombus in the atrial cavity or appendage.  ------------------------------------------------------------------- Right ventricle: RVEF is normal.  ------------------------------------------------------------------- Tricuspid valve: TV is normal. MIld TR.     12/2014 TTE Study Conclusions   - Left ventricle: The cavity size was normal. Wall thickness was   normal. Systolic function was normal. The estimated ejection   fraction was in the range of 60% to 65%. Wall motion was normal;   there were no regional wall motion abnormalities. Left   ventricular diastolic function parameters were normal. - Aortic valve: Valve area (VTI): 3.74 cm^2. Valve area (Vmax):   3.49 cm^2. Valve area (Vmean): 3.65 cm^2. - Atrial septum: No defect or patent foramen ovale was identified. - Technially adequate study.   05/2015 Nuclear stress test There was no ST segment deviation noted during stress. The study is normal. No myocardial ischemia or scar. This is a low risk study. Nuclear stress EF: 52%   08/2018 heart monitor 12 day holter monitor report Min HR 55, Max HR 161, Avg HR 85 Occasional supraventricular ectopy in the form of isolated PACs, coupltes. Multiple runs of SVT longest 22 beats, probable atach. Occasional ventricular ectopy (2.2% burden) in the form of isolated PVCs, bigeminy, trigeminy, couplets. 6 short runs of NSVT longest 7 beats. Reported symptoms corresponded with supraventricular and ventricular ectopy as reported above.   Assessment and Plan   1. Afib/acquired thrombophilia - no recent  symptoms, continue current meds including xarelto for stroke prevetnion. CrCl 97 today, continue xarelto 20mg  dosing.        2. HTN - above goal, increase diltiazem to 360mg  daily  3. Mitral regurgitation - mild my 2016 echo. Has murmur on exam, update echo     Antoine Poche, M.D.,

## 2023-02-09 NOTE — Patient Instructions (Signed)
Medication Instructions:  Your physician has recommended you make the following change in your medication:   -Increase Diltiazem to 360 mg tablet once daily.   *If you need a refill on your cardiac medications before your next appointment, please call your pharmacy*   Lab Work: None If you have labs (blood work) drawn today and your tests are completely normal, you will receive your results only by: MyChart Message (if you have MyChart) OR A paper copy in the mail If you have any lab test that is abnormal or we need to change your treatment, we will call you to review the results.   Testing/Procedures: Your physician has requested that you have an echocardiogram. Echocardiography is a painless test that uses sound waves to create images of your heart. It provides your doctor with information about the size and shape of your heart and how well your heart's chambers and valves are working. This procedure takes approximately one hour. There are no restrictions for this procedure. Please do NOT wear cologne, perfume, aftershave, or lotions (deodorant is allowed). Please arrive 15 minutes prior to your appointment time.  Please note: We ask at that you not bring children with you during ultrasound (echo/ vascular) testing. Due to room size and safety concerns, children are not allowed in the ultrasound rooms during exams. Our front office staff cannot provide observation of children in our lobby area while testing is being conducted. An adult accompanying a patient to their appointment will only be allowed in the ultrasound room at the discretion of the ultrasound technician under special circumstances. We apologize for any inconvenience.    Follow-Up: At [facility], you and your health needs are our priority.  As part of our continuing mission to provide you with exceptional heart care, we have created designated Provider Care Teams.  These Care Teams include your primary  Cardiologist (physician) and Advanced Practice Providers (APPs -  Physician Assistants and Nurse Practitioners) who all work together to provide you with the care you need, when you need it.  We recommend signing up for the patient portal called "MyChart".  Sign up information is provided on this After Visit Summary.  MyChart is used to connect with patients for Virtual Visits (Telemedicine).  Patients are able to view lab/test results, encounter notes, upcoming appointments, etc.  Non-urgent messages can be sent to your provider as well.   To learn more about what you can do with MyChart, go to [email or web address].    Your next appointment:   1 year(s)  Provider:   You may see Dina Rich, MD\ or one of the following Advanced Practice Providers on your designated Care Team:   Randall An, PA-C  Jacolyn Reedy, [place]     Other Instructions

## 2023-02-23 ENCOUNTER — Ambulatory Visit (HOSPITAL_COMMUNITY)
Admission: RE | Admit: 2023-02-23 | Discharge: 2023-02-23 | Disposition: A | Discharge status: Home / Self Care | Payer: Medicare HMO | Source: Ambulatory Visit | Attending: Cardiology | Admitting: Cardiology

## 2023-02-23 DIAGNOSIS — I340 Nonrheumatic mitral (valve) insufficiency: Secondary | ICD-10-CM | POA: Insufficient documentation

## 2023-02-23 LAB — ECHOCARDIOGRAM COMPLETE
AR max vel: 2.07 cm2
AV Area VTI: 2.17 cm2
AV Area mean vel: 2.07 cm2
AV Mean grad: 5 mm[Hg]
AV Peak grad: 8.4 mm[Hg]
Ao pk vel: 1.45 m/s
Area-P 1/2: 3.63 cm2
S' Lateral: 3.4 cm

## 2023-03-30 ENCOUNTER — Other Ambulatory Visit: Payer: Self-pay

## 2023-03-30 MED ORDER — DILTIAZEM HCL ER COATED BEADS 360 MG PO CP24: 360.0000 mg | ORAL_CAPSULE | Freq: Every day | ORAL | 3 refills | Status: DC

## 2023-05-12 DIAGNOSIS — H04123 Dry eye syndrome of bilateral lacrimal glands: Secondary | ICD-10-CM | POA: Diagnosis not present

## 2023-12-07 DIAGNOSIS — G4700 Insomnia, unspecified: Secondary | ICD-10-CM | POA: Diagnosis not present

## 2023-12-07 DIAGNOSIS — Z7689 Persons encountering health services in other specified circumstances: Secondary | ICD-10-CM | POA: Diagnosis not present

## 2023-12-07 DIAGNOSIS — R519 Headache, unspecified: Secondary | ICD-10-CM | POA: Diagnosis not present

## 2023-12-07 DIAGNOSIS — I10 Essential (primary) hypertension: Secondary | ICD-10-CM | POA: Diagnosis not present

## 2023-12-07 DIAGNOSIS — I4891 Unspecified atrial fibrillation: Secondary | ICD-10-CM | POA: Diagnosis not present

## 2023-12-07 DIAGNOSIS — K219 Gastro-esophageal reflux disease without esophagitis: Secondary | ICD-10-CM | POA: Diagnosis not present

## 2023-12-07 DIAGNOSIS — F5104 Psychophysiologic insomnia: Secondary | ICD-10-CM | POA: Diagnosis not present

## 2024-01-20 ENCOUNTER — Other Ambulatory Visit (HOSPITAL_COMMUNITY): Payer: Self-pay | Admitting: Family Medicine

## 2024-01-20 DIAGNOSIS — Z1231 Encounter for screening mammogram for malignant neoplasm of breast: Secondary | ICD-10-CM

## 2024-01-29 ENCOUNTER — Ambulatory Visit (HOSPITAL_COMMUNITY): Admission: RE | Admit: 2024-01-29 | Discharge: 2024-01-29 | Disposition: A | Source: Ambulatory Visit

## 2024-01-29 DIAGNOSIS — Z1231 Encounter for screening mammogram for malignant neoplasm of breast: Secondary | ICD-10-CM | POA: Insufficient documentation

## 2024-02-04 ENCOUNTER — Ambulatory Visit: Admitting: Cardiology

## 2024-02-06 ENCOUNTER — Other Ambulatory Visit: Payer: Self-pay | Admitting: Cardiology

## 2024-02-15 ENCOUNTER — Ambulatory Visit: Admitting: Cardiology

## 2024-02-22 ENCOUNTER — Ambulatory Visit: Admitting: Cardiology
